# Patient Record
Sex: Female | Born: 1960 | Race: White | Hispanic: No | Smoking: Former smoker
Health system: Southern US, Community
[De-identification: ages and names within clinical notes are randomized; demographics above are authoritative.]

## PROBLEM LIST (undated history)

## (undated) DIAGNOSIS — J189 Pneumonia, unspecified organism: Secondary | ICD-10-CM

## (undated) DIAGNOSIS — M546 Pain in thoracic spine: Secondary | ICD-10-CM

## (undated) DIAGNOSIS — M545 Low back pain, unspecified: Secondary | ICD-10-CM

## (undated) DIAGNOSIS — K219 Gastro-esophageal reflux disease without esophagitis: Secondary | ICD-10-CM

## (undated) DIAGNOSIS — G8929 Other chronic pain: Secondary | ICD-10-CM

## (undated) DIAGNOSIS — R519 Headache, unspecified: Secondary | ICD-10-CM

## (undated) DIAGNOSIS — M47816 Spondylosis without myelopathy or radiculopathy, lumbar region: Secondary | ICD-10-CM

## (undated) DIAGNOSIS — G894 Chronic pain syndrome: Secondary | ICD-10-CM

## (undated) DIAGNOSIS — Z79890 Hormone replacement therapy (postmenopausal): Secondary | ICD-10-CM

## (undated) DIAGNOSIS — R300 Dysuria: Secondary | ICD-10-CM

## (undated) DIAGNOSIS — R51 Headache: Secondary | ICD-10-CM

## (undated) DIAGNOSIS — R200 Anesthesia of skin: Secondary | ICD-10-CM

## (undated) DIAGNOSIS — M5126 Other intervertebral disc displacement, lumbar region: Secondary | ICD-10-CM

## (undated) DIAGNOSIS — C441291 Squamous cell carcinoma of skin of left upper eyelid, including canthus: Secondary | ICD-10-CM

## (undated) DIAGNOSIS — C569 Malignant neoplasm of unspecified ovary: Secondary | ICD-10-CM

## (undated) DIAGNOSIS — C73 Malignant neoplasm of thyroid gland: Secondary | ICD-10-CM

## (undated) DIAGNOSIS — I208 Other forms of angina pectoris: Secondary | ICD-10-CM

## (undated) DIAGNOSIS — K589 Irritable bowel syndrome without diarrhea: Secondary | ICD-10-CM

## (undated) DIAGNOSIS — I2089 Other forms of angina pectoris: Secondary | ICD-10-CM

## (undated) DIAGNOSIS — M810 Age-related osteoporosis without current pathological fracture: Secondary | ICD-10-CM

## (undated) DIAGNOSIS — R202 Paresthesia of skin: Secondary | ICD-10-CM

## (undated) DIAGNOSIS — M542 Cervicalgia: Secondary | ICD-10-CM

## (undated) DIAGNOSIS — M5416 Radiculopathy, lumbar region: Secondary | ICD-10-CM

## (undated) DIAGNOSIS — J449 Chronic obstructive pulmonary disease, unspecified: Secondary | ICD-10-CM

## (undated) DIAGNOSIS — I200 Unstable angina: Secondary | ICD-10-CM

## (undated) DIAGNOSIS — F419 Anxiety disorder, unspecified: Secondary | ICD-10-CM

## (undated) DIAGNOSIS — N959 Unspecified menopausal and perimenopausal disorder: Secondary | ICD-10-CM

## (undated) DIAGNOSIS — W1789XA Other fall from one level to another, initial encounter: Secondary | ICD-10-CM

## (undated) DIAGNOSIS — J42 Unspecified chronic bronchitis: Secondary | ICD-10-CM

## (undated) DIAGNOSIS — Z87898 Personal history of other specified conditions: Secondary | ICD-10-CM

## (undated) DIAGNOSIS — G43009 Migraine without aura, not intractable, without status migrainosus: Secondary | ICD-10-CM

## (undated) HISTORY — DX: Other forms of angina pectoris: I20.89

## (undated) HISTORY — PX: SKIN CANCER EXCISION: SHX779

## (undated) HISTORY — DX: Spondylosis without myelopathy or radiculopathy, lumbar region: M47.816

## (undated) HISTORY — PX: CARDIAC CATHETERIZATION: SHX172

## (undated) HISTORY — DX: Chronic pain syndrome: G89.4

## (undated) HISTORY — DX: Chronic obstructive pulmonary disease, unspecified: J44.9

## (undated) HISTORY — DX: Pain in thoracic spine: M54.6

## (undated) HISTORY — DX: Dysuria: R30.0

## (undated) HISTORY — DX: Anxiety disorder, unspecified: F41.9

## (undated) HISTORY — DX: Migraine without aura, not intractable, without status migrainosus: G43.009

## (undated) HISTORY — PX: TUBAL LIGATION: SHX77

## (undated) HISTORY — DX: Irritable bowel syndrome, unspecified: K58.9

## (undated) HISTORY — PX: THYROIDECTOMY: SHX17

## (undated) HISTORY — DX: Paresthesia of skin: R20.2

## (undated) HISTORY — DX: Personal history of other specified conditions: Z87.898

## (undated) HISTORY — PX: CHOLECYSTECTOMY OPEN: SUR202

## (undated) HISTORY — PX: DILATION AND CURETTAGE OF UTERUS: SHX78

## (undated) HISTORY — DX: Unstable angina: I20.0

## (undated) HISTORY — PX: VAGINAL HYSTERECTOMY: SUR661

## (undated) HISTORY — DX: Other intervertebral disc displacement, lumbar region: M51.26

## (undated) HISTORY — DX: Anesthesia of skin: R20.0

## (undated) HISTORY — DX: Age-related osteoporosis without current pathological fracture: M81.0

## (undated) HISTORY — DX: Malignant neoplasm of unspecified ovary: C56.9

## (undated) HISTORY — DX: Radiculopathy, lumbar region: M54.16

## (undated) HISTORY — DX: Gastro-esophageal reflux disease without esophagitis: K21.9

## (undated) HISTORY — DX: Hormone replacement therapy: Z79.890

## (undated) HISTORY — DX: Squamous cell carcinoma of skin of left upper eyelid, including canthus: C44.1291

## (undated) HISTORY — DX: Cervicalgia: M54.2

## (undated) HISTORY — DX: Other forms of angina pectoris: I20.8

## (undated) HISTORY — DX: Unspecified menopausal and perimenopausal disorder: N95.9

## (undated) HISTORY — PX: BACK SURGERY: SHX140

## (undated) HISTORY — DX: Malignant neoplasm of thyroid gland: C73

---

## 1988-09-11 DIAGNOSIS — C569 Malignant neoplasm of unspecified ovary: Secondary | ICD-10-CM

## 1988-09-11 HISTORY — DX: Malignant neoplasm of unspecified ovary: C56.9

## 1998-09-11 HISTORY — PX: POSTERIOR LUMBAR FUSION: SHX6036

## 2001-11-18 ENCOUNTER — Other Ambulatory Visit: Admission: RE | Admit: 2001-11-18 | Discharge: 2001-11-18 | Payer: Self-pay | Admitting: Family Medicine

## 2003-10-08 ENCOUNTER — Other Ambulatory Visit: Admission: RE | Admit: 2003-10-08 | Discharge: 2003-10-08 | Payer: Self-pay | Admitting: Family Medicine

## 2004-08-08 ENCOUNTER — Ambulatory Visit: Payer: Self-pay | Admitting: Family Medicine

## 2005-04-04 ENCOUNTER — Ambulatory Visit: Payer: Self-pay | Admitting: Family Medicine

## 2005-04-04 ENCOUNTER — Other Ambulatory Visit: Admission: RE | Admit: 2005-04-04 | Discharge: 2005-04-04 | Payer: Self-pay | Admitting: Family Medicine

## 2005-05-18 ENCOUNTER — Ambulatory Visit: Payer: Self-pay | Admitting: Family Medicine

## 2005-10-05 ENCOUNTER — Ambulatory Visit: Payer: Self-pay | Admitting: Family Medicine

## 2009-08-24 ENCOUNTER — Encounter: Admission: RE | Admit: 2009-08-24 | Discharge: 2009-08-24 | Payer: Self-pay | Admitting: Cardiovascular Disease

## 2009-08-25 ENCOUNTER — Ambulatory Visit (HOSPITAL_COMMUNITY): Admission: RE | Admit: 2009-08-25 | Discharge: 2009-08-25 | Payer: Self-pay | Admitting: Cardiovascular Disease

## 2011-01-19 ENCOUNTER — Ambulatory Visit
Admission: RE | Admit: 2011-01-19 | Discharge: 2011-01-19 | Disposition: A | Discharge status: Home / Self Care | Payer: Medicare Other | Source: Ambulatory Visit | Attending: Cardiovascular Disease | Admitting: Cardiovascular Disease

## 2011-01-19 ENCOUNTER — Other Ambulatory Visit: Payer: Self-pay | Admitting: Cardiovascular Disease

## 2011-01-30 ENCOUNTER — Ambulatory Visit (HOSPITAL_COMMUNITY): Admission: RE | Admit: 2011-01-30 | Payer: Medicare Other | Source: Ambulatory Visit | Admitting: Cardiovascular Disease

## 2015-11-02 DIAGNOSIS — M546 Pain in thoracic spine: Secondary | ICD-10-CM

## 2015-11-02 DIAGNOSIS — M5416 Radiculopathy, lumbar region: Secondary | ICD-10-CM

## 2015-11-02 DIAGNOSIS — M542 Cervicalgia: Secondary | ICD-10-CM

## 2015-11-02 DIAGNOSIS — G43009 Migraine without aura, not intractable, without status migrainosus: Secondary | ICD-10-CM | POA: Insufficient documentation

## 2015-11-02 DIAGNOSIS — G894 Chronic pain syndrome: Secondary | ICD-10-CM

## 2015-11-02 HISTORY — DX: Pain in thoracic spine: M54.6

## 2015-11-02 HISTORY — DX: Migraine without aura, not intractable, without status migrainosus: G43.009

## 2015-11-02 HISTORY — DX: Radiculopathy, lumbar region: M54.16

## 2015-11-02 HISTORY — DX: Cervicalgia: M54.2

## 2015-11-02 HISTORY — DX: Chronic pain syndrome: G89.4

## 2016-01-05 DIAGNOSIS — N959 Unspecified menopausal and perimenopausal disorder: Secondary | ICD-10-CM

## 2016-01-05 HISTORY — DX: Unspecified menopausal and perimenopausal disorder: N95.9

## 2016-02-10 DIAGNOSIS — R200 Anesthesia of skin: Secondary | ICD-10-CM

## 2016-02-10 DIAGNOSIS — R202 Paresthesia of skin: Secondary | ICD-10-CM

## 2016-02-10 HISTORY — DX: Anesthesia of skin: R20.0

## 2016-03-28 DIAGNOSIS — M5136 Other intervertebral disc degeneration, lumbar region: Secondary | ICD-10-CM

## 2016-03-28 DIAGNOSIS — M51369 Other intervertebral disc degeneration, lumbar region without mention of lumbar back pain or lower extremity pain: Secondary | ICD-10-CM | POA: Insufficient documentation

## 2016-03-28 DIAGNOSIS — M5126 Other intervertebral disc displacement, lumbar region: Secondary | ICD-10-CM

## 2016-03-28 HISTORY — DX: Other intervertebral disc degeneration, lumbar region: M51.36

## 2016-03-28 HISTORY — DX: Other intervertebral disc degeneration, lumbar region without mention of lumbar back pain or lower extremity pain: M51.369

## 2016-03-28 HISTORY — DX: Other intervertebral disc displacement, lumbar region: M51.26

## 2016-04-19 DIAGNOSIS — M47816 Spondylosis without myelopathy or radiculopathy, lumbar region: Secondary | ICD-10-CM

## 2016-04-19 HISTORY — DX: Spondylosis without myelopathy or radiculopathy, lumbar region: M47.816

## 2016-12-20 DIAGNOSIS — Z87898 Personal history of other specified conditions: Secondary | ICD-10-CM

## 2016-12-20 HISTORY — DX: Personal history of other specified conditions: Z87.898

## 2017-01-31 DIAGNOSIS — Z79890 Hormone replacement therapy (postmenopausal): Secondary | ICD-10-CM

## 2017-01-31 DIAGNOSIS — R300 Dysuria: Secondary | ICD-10-CM | POA: Insufficient documentation

## 2017-01-31 HISTORY — DX: Hormone replacement therapy: Z79.890

## 2017-01-31 HISTORY — DX: Dysuria: R30.0

## 2017-02-07 DIAGNOSIS — Z90710 Acquired absence of both cervix and uterus: Secondary | ICD-10-CM | POA: Insufficient documentation

## 2017-06-19 DIAGNOSIS — R002 Palpitations: Secondary | ICD-10-CM | POA: Diagnosis not present

## 2017-06-29 ENCOUNTER — Telehealth: Payer: Self-pay | Admitting: Cardiovascular Disease

## 2017-06-29 NOTE — Telephone Encounter (Signed)
Received records from [facility] for appointment on 07/20/17 with Dr Gwenlyn Found.  Records put with Dr Kennon Holter schedule for 11/9/

## 2017-07-20 ENCOUNTER — Ambulatory Visit: Payer: Medicare Other | Admitting: Cardiovascular Disease

## 2017-07-31 ENCOUNTER — Ambulatory Visit: Payer: Medicare Other | Admitting: Cardiovascular Disease

## 2017-08-15 ENCOUNTER — Ambulatory Visit: Payer: Medicare Other | Admitting: Cardiovascular Disease

## 2017-08-21 ENCOUNTER — Ambulatory Visit: Payer: Medicare Other | Admitting: Cardiovascular Disease

## 2017-08-24 ENCOUNTER — Ambulatory Visit: Payer: Medicare Other | Admitting: Cardiovascular Disease

## 2017-09-14 ENCOUNTER — Ambulatory Visit: Payer: Medicare Other | Admitting: Cardiovascular Disease

## 2017-09-25 ENCOUNTER — Ambulatory Visit: Payer: Medicare Other | Admitting: Cardiovascular Disease

## 2017-09-26 ENCOUNTER — Encounter: Payer: Self-pay | Admitting: *Deleted

## 2018-01-17 DIAGNOSIS — R002 Palpitations: Secondary | ICD-10-CM | POA: Diagnosis not present

## 2018-01-28 DIAGNOSIS — I209 Angina pectoris, unspecified: Secondary | ICD-10-CM | POA: Insufficient documentation

## 2018-01-28 DIAGNOSIS — I200 Unstable angina: Secondary | ICD-10-CM

## 2018-01-28 DIAGNOSIS — J449 Chronic obstructive pulmonary disease, unspecified: Secondary | ICD-10-CM

## 2018-01-28 DIAGNOSIS — K589 Irritable bowel syndrome without diarrhea: Secondary | ICD-10-CM | POA: Insufficient documentation

## 2018-01-28 DIAGNOSIS — K219 Gastro-esophageal reflux disease without esophagitis: Secondary | ICD-10-CM

## 2018-01-28 DIAGNOSIS — F411 Generalized anxiety disorder: Secondary | ICD-10-CM | POA: Insufficient documentation

## 2018-01-28 HISTORY — DX: Gastro-esophageal reflux disease without esophagitis: K21.9

## 2018-01-28 HISTORY — DX: Unstable angina: I20.0

## 2018-01-28 HISTORY — DX: Chronic obstructive pulmonary disease, unspecified: J44.9

## 2018-01-29 DIAGNOSIS — C569 Malignant neoplasm of unspecified ovary: Secondary | ICD-10-CM | POA: Insufficient documentation

## 2018-02-05 DIAGNOSIS — R079 Chest pain, unspecified: Secondary | ICD-10-CM | POA: Insufficient documentation

## 2018-02-05 NOTE — Progress Notes (Signed)
Cardiology Office Note:    Date:  02/06/2018   ID:  Melissa Conley, DOB [birth year], MRN [id number]  PCP:  Melissa Roche, NP  Cardiologist:  Melissa More, MD   Referring MD: Melissa Roche, NP  ASSESSMENT:    1. Chest pain in adult   2. Chronic obstructive pulmonary disease, unspecified COPD type ([place])   3. Chronic pain syndrome   4. SOB (shortness of breath) on exertion    PLAN:    In order of problems listed above:  1. Her symptoms are atypical but occurring Conley frequently and at times are anginal in nature.  Is been close to 10 years since she had normal coronary arteriography I think she would benefit for further evaluation and after discussion of noninvasive techniques will undergo cardiac CTA.  She has no dye allergy she will continue low-dose aspirin and I will reassess back in the office in 6 weeks 2. Likely because of her shortness of breath however she is at risk for underlying heart disease heart failure with edema have asked her to schedule echocardiogram and with labs today we will check a BNP 3. Stable managed by her PCP 4. Differential diagnosis is that of COPD versus cardiac etiology especially heart failure.  BNP level ordered echocardiogram scheduled  Next appointment 6 weeks   Medication Adjustments/Labs and Tests Ordered: Current medicines are reviewed at length with the patient today.  Concerns regarding medicines are outlined above.  Orders Placed This Encounter  Procedures  . CT CORONARY MORPH W/CTA COR W/SCORE W/CA W/CM &/OR WO/CM  . CT CORONARY FRACTIONAL FLOW RESERVE DATA PREP  . CT CORONARY FRACTIONAL FLOW RESERVE FLUID ANALYSIS  . Troponin I  . CBC  . Comprehensive Metabolic Panel (CMET)  . Lipid Profile  . EKG 12-Lead  . ECHOCARDIOGRAM COMPLETE   Meds ordered this encounter  Medications  . metoprolol tartrate (LOPRESSOR) 50 MG tablet    Sig: Take 1 tablet (50 mg total) by mouth once for 1 dose. Take 1 tablet 1 hour prior to cardiac CTA.   Dispense:  1 tablet    Refill:  0     Chief Complaint  Patient presents with  . New Patient (Initial Visit)  . Chest Pain    History of Present Illness:    Melissa Conley is a 57 y.o. female with COPD, chronic pain and narcotic therapy and  normal coronary arteriography 08/25/09 who is being seen today for the evaluation of chest pain at the request of Melissa Roche, NP.  Recently she has had increased chest pain.  She was last seen by 1 of my former partners less than 2 years ago had a myocardial perfusion study that apparently was normal and she had no further follow-up in the office.  She has symptoms that sound clearly musculoskeletal with pain in the left shoulder down the hand with numbness and paresthesia.  She is also having episodes that tend to wake her from her sleep happens with activities such as bathing and walking the dog where she gets pressure substernally with severe shortness of breath and forces her to stop and rest.  The episodes also occur without physical effort.  She underwent coronary arteriography was normal 10 years ago.  The symptoms have worsened in the nocturnal and at exertional symptoms are new.  It is not pleuritic in nature but is associated with marked shortness of breath and she finds herself increasingly short of breath even with activities like ADLs.  She says she  gets intermittent edema on her ankles and because of chronic back pain sleeps with marked elevation of the head of the bed.  She has had palpitation not severe sustained has had dizziness but no syncope.  She has no background history of congenital or rheumatic heart disease  She was seen by Melissa Conley 06/07/16: 1. Atypical chest pain is probably chest wall, doubt angina 2. DOE attributable to asthma, could possibly represent anginal equivalent 3. Mild mitral regurg on echo can be a normal finding, especially if as per report the valve is structurally normal, and there is no mitral regurg murmur  on Px exam PLAN: Lexiscan perfusion stress test RTC Cardiology: PRN abnl test results HPI: 57 yo WF followed by Melissa Conley group for asthma on inhalers, in whom complaints of several months of Conley prominent exertional dyspnea and episodid well-localized non-exertional upper chest discomfort prompted an echocardiographic exam; Pt is referred by Melissa Roche NP for reported mild mitral regurg on the echo. As noted, pt describes worsening exertional dyspnea in recent months, no orthopnea or PND. Some occasional mild bipedal edema. Chest pains are both right and left upper chest, no clear precipitating or exacerbating factors. No assoc nausea. Occas transient palpitations, no sustained rapid heart action syncope or near-syncope.    Past Medical History:  Diagnosis Date  . Acid reflux   . Angina at rest [facility])   . Anxiety   . Bulging lumbar disc 03/28/2016   Overview:  L4-5, left  . Chronic back pain   . Chronic pain syndrome 11/02/2015  . Common migraine 11/02/2015  . COPD (chronic obstructive pulmonary disease) ([place]) 01/28/2018  . Dysuria 01/31/2017  . Generalized anxiety disorder 01/28/2018  . GERD without esophagitis 01/28/2018  . H/O: hysterectomy 02/07/2017  . History of abnormal mammogram 12/20/2016  . IBS (irritable bowel syndrome)   . Lumbar radiculopathy 11/02/2015  . Menopausal disorder 01/05/2016  . Neck pain 11/02/2015  . Numbness and tingling of both legs below knees 02/10/2016  . Ovarian cancer ([place])   . Postmenopausal HRT (hormone replacement therapy) 01/31/2017  . 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 Thoracic back pain 11/02/2015  . Unstable angina ([place]) 01/28/2018    Past Surgical History:  Procedure Laterality Date  . ABDOMINAL HYSTERECTOMY    . CARDIAC CATHETERIZATION    . CHOLECYSTECTOMY    . LUMBAR FUSION  2000    Current Medications: Current Meds  Medication Sig  . albuterol (PROAIR HFA) 108 (90 Base) MCG/ACT inhaler Inhale 1  puff into the lungs 4 (four) times daily as needed for shortness of breath.   Marland Kitchen aspirin EC 81 MG tablet Take 81 mg by mouth daily.  . cyanocobalamin (,VITAMIN B-12,) 1000 MCG/ML injection Inject 1 mL into the muscle every 30 (thirty) days.  Marland Kitchen EPINEPHrine 0.3 mg/0.3 mL IJ SOAJ injection Inject 0.3 mg into the muscle as directed.  . estrogens, conjugated, (PREMARIN) 0.625 MG tablet Take 0.625 mg by mouth daily.  . fluticasone (FLONASE) 50 MCG/ACT nasal spray Place 1 spray into both nostrils daily as needed for allergies or rhinitis.   Marland Kitchen loratadine (CLARITIN) 10 MG tablet Take 10 mg by mouth daily as needed for allergies.   . Multiple Vitamin (MULTIVITAMIN) tablet Take 1 tablet by mouth daily.  . nitroGLYCERIN (NITROSTAT) 0.4 MG SL tablet Place 0.4 mg every 5 (five) minutes as needed under the tongue for chest pain.  Marland Kitchen oxyCODONE (ROXICODONE) 15 MG immediate release tablet Take 15 mg every  4 (four) hours as needed by mouth for pain.  . Polyethylene Glycol 3350 (PEG 3350) POWD Take by mouth. TAKE AS DIRECTED     Allergies:   Other; Penicillins; Gabapentin; Tapentadol; Methocarbamol; Morphine; and Tizanidine   Social History   Socioeconomic History  . Marital status: Single    Spouse name: Not on file  . Number of children: Not on file  . Years of education: Not on file  . Highest education level: Not on file  Occupational History  . Not on file  Social Needs  . Financial resource strain: Not on file  . Food insecurity:    Worry: Not on file    Inability: Not on file  . Transportation needs:    Medical: Not on file    Non-medical: Not on file  Tobacco Use  . Smoking status: Former Smoker    Packs/day: 0.50    Years: 10.00    Pack years: 5.00  . Smokeless tobacco: Never Used  . Tobacco comment: quit 14 years ago   Substance and Sexual Activity  . Alcohol use: Not Currently  . Drug use: Not Currently  . Sexual activity: Not on file  Lifestyle  . Physical activity:    Days per  week: Not on file    Minutes per session: Not on file  . Stress: Not on file  Relationships  . Social connections:    Talks on phone: Not on file    Gets together: Not on file    Attends religious service: Not on file    Active member of club or organization: Not on file    Attends meetings of clubs or organizations: Not on file    Relationship status: Not on file  Other Topics Concern  . Not on file  Social History Narrative  . Not on file     Family History: The patient's family history includes Bone cancer in her brother; Heart attack in her mother; Lung cancer in her brother and mother.  ROS:   Review of Systems  Constitution: Positive for malaise/fatigue.  HENT: Negative.   Cardiovascular: Positive for chest pain, dyspnea on exertion and palpitations.  Respiratory: Positive for cough and shortness of breath. Negative for sputum production and wheezing.   Endocrine: Negative.   Hematologic/Lymphatic: Negative.   Skin: Negative.   Musculoskeletal: Positive for back pain.  Gastrointestinal: Negative.   Genitourinary: Negative.   Neurological: Positive for dizziness.  Psychiatric/Behavioral: Positive for memory loss.  Allergic/Immunologic: Negative.    Please see the history of present illness.     All other systems reviewed and are negative.  EKGs/Labs/Other Studies Reviewed:    The following studies were reviewed today:   EKG:  EKG is  ordered today.  The ekg ordered today demonstrates sinus rhythm normal  Recent Labs: Office labs from December reviewed No results found for requested labs within last 8760 hours.  Recent Lipid Panel No results found for: CHOL, TRIG, HDL, CHOLHDL, VLDL, LDLCALC, LDLDIRECT  Physical Exam:    VS:  BP (!) 88/68 (BP Location: Left Arm, Patient Position: Sitting, Cuff Size: Normal)   Pulse 60   Ht 5\' 5"  (1.651 m)   Wt 170 lb (77.1 kg)   SpO2 98%   BMI 28.29 kg/m     Wt Readings from Last 3 Encounters:  02/06/18 170 lb (77.1  kg)     GEN:  Well nourished, well developed in no acute distress HEENT: Normal NECK: No JVD; No carotid bruits LYMPHATICS: No  lymphadenopathy CARDIAC: RRR, no murmurs, rubs, gallops RESPIRATORY:  Clear to auscultation without rales, wheezing or rhonchi  ABDOMEN: Soft, non-tender, non-distended MUSCULOSKELETAL:  No edema; No deformity  SKIN: Warm and dry NEUROLOGIC:  Alert and oriented x 3 PSYCHIATRIC:  Normal affect     Signed, Melissa More, MD  02/06/2018 11:31 AM    [place]

## 2018-02-06 ENCOUNTER — Encounter: Payer: Self-pay | Admitting: Cardiology

## 2018-02-06 ENCOUNTER — Ambulatory Visit (INDEPENDENT_AMBULATORY_CARE_PROVIDER_SITE_OTHER): Payer: Medicare Other | Admitting: Cardiology

## 2018-02-06 VITALS — BP 88/68 | HR 60 | Ht 65.0 in | Wt 170.0 lb

## 2018-02-06 DIAGNOSIS — I209 Angina pectoris, unspecified: Secondary | ICD-10-CM

## 2018-02-06 DIAGNOSIS — R0602 Shortness of breath: Secondary | ICD-10-CM | POA: Diagnosis not present

## 2018-02-06 DIAGNOSIS — R079 Chest pain, unspecified: Secondary | ICD-10-CM | POA: Diagnosis not present

## 2018-02-06 DIAGNOSIS — G894 Chronic pain syndrome: Secondary | ICD-10-CM

## 2018-02-06 DIAGNOSIS — J449 Chronic obstructive pulmonary disease, unspecified: Secondary | ICD-10-CM

## 2018-02-06 MED ORDER — METOPROLOL TARTRATE 50 MG PO TABS: 50.0000 mg | ORAL_TABLET | Freq: Once | ORAL | 0 refills | Status: DC

## 2018-02-06 NOTE — Patient Instructions (Addendum)
Medication Instructions:  Your physician recommends that you continue on your current medications as directed. Please refer to the Current Medication list given to you today.  Labwork: Your physician recommends that you have the following labs drawn: CBC, CMP, lipid, troponin  Testing/Procedures: Your physician has requested that you have an echocardiogram. Echocardiography is a painless test that uses sound waves to create images of your heart. It provides your doctor with information about the size and shape of your heart and how well your heart's chambers and valves are working. This procedure takes approximately one hour. There are no restrictions for this procedure.  Your physician has requested that you have cardiac CT. Cardiac computed tomography (CT) is a painless test that uses an x-ray machine to take clear, detailed pictures of your heart. For further information please visit [email or web address]tn. Please follow instruction sheet as given.  Please arrive at the [facility] main entrance of [facility] at xx:xx AM (30-45 minutes prior to test start time)  [facility], [place] [address] [phone]  Proceed to the [facility] Radiology Department (First Floor).  Please follow these instructions carefully (unless otherwise directed):  Hold all erectile dysfunction medications at least 48 hours prior to test.  On the Night Before the Test: . Drink plenty of water. . Do not consume any caffeinated/decaffeinated beverages or chocolate 12 hours prior to your test. . Do not take any antihistamines 12 hours prior to your test.  On the Day of the Test: . Drink plenty of water. Do not drink any water within one hour of the test. . Do not eat any food 4 hours prior to the test. . You may take your regular medications prior to the test. . IF NOT ON A BETA BLOCKER - Take 50 mg of lopressor (metoprolol) one hour before the test.  After the  Test: . Drink plenty of water. . After receiving IV contrast, you may experience a mild flushed feeling. This is normal. . On occasion, you may experience a mild rash up to 24 hours after the test. This is not dangerous. If this occurs, you can take Benadryl 25 mg and increase your fluid intake. . If you experience trouble breathing, this can be serious. If it is severe call 911 IMMEDIATELY. If it is mild, please call our office.  Follow-Up: Your physician recommends that you schedule a follow-up appointment in: 6 weeks.  Any Other Special Instructions Will Be Listed Below (If Applicable).     If you need a refill on your cardiac medications before your next appointment, please call your pharmacy.

## 2018-02-07 LAB — CBC
HEMATOCRIT: 39.8 % (ref 34.0–46.6)
HEMOGLOBIN: 12.9 g/dL (ref 11.1–15.9)
MCH: 28.9 pg (ref 26.6–33.0)
MCHC: 32.4 g/dL (ref 31.5–35.7)
MCV: 89 fL (ref 79–97)
Platelets: 359 10*3/uL (ref 150–450)
RBC: 4.47 x10E6/uL (ref 3.77–5.28)
RDW: 13.3 % (ref 12.3–15.4)
WBC: 5.9 10*3/uL (ref 3.4–10.8)

## 2018-02-07 LAB — LIPID PANEL
CHOL/HDL RATIO: 3.2 ratio (ref 0.0–4.4)
Cholesterol, Total: 198 mg/dL (ref 100–199)
HDL: 62 mg/dL (ref >39)
LDL Calculated: 103 mg/dL — ABNORMAL HIGH (ref 0–99)
TRIGLYCERIDES: 167 mg/dL — AB (ref 0–149)
VLDL Cholesterol Cal: 33 mg/dL (ref 5–40)

## 2018-02-07 LAB — COMPREHENSIVE METABOLIC PANEL
A/G RATIO: 1.6 (ref 1.2–2.2)
ALBUMIN: 4.5 g/dL (ref 3.5–5.5)
ALT: 18 IU/L (ref 0–32)
AST: 19 IU/L (ref 0–40)
Alkaline Phosphatase: 99 IU/L (ref 39–117)
BUN / CREAT RATIO: 13 (ref 9–23)
BUN: 10 mg/dL (ref 6–24)
Bilirubin Total: 0.3 mg/dL (ref 0.0–1.2)
CO2: 24 mmol/L (ref 20–29)
Calcium: 9.7 mg/dL (ref 8.7–10.2)
Chloride: 102 mmol/L (ref 96–106)
Creatinine, Ser: 0.76 mg/dL (ref 0.57–1.00)
GFR calc Af Amer: 103 mL/min/{1.73_m2} (ref >59)
GFR, EST NON AFRICAN AMERICAN: 89 mL/min/{1.73_m2} (ref >59)
GLOBULIN, TOTAL: 2.8 g/dL (ref 1.5–4.5)
Glucose: 85 mg/dL (ref 65–99)
POTASSIUM: 4.7 mmol/L (ref 3.5–5.2)
SODIUM: 143 mmol/L (ref 134–144)
Total Protein: 7.3 g/dL (ref 6.0–8.5)

## 2018-02-07 LAB — TROPONIN I: Troponin I: <0.01 ng/mL (ref 0.00–0.04)

## 2018-02-21 ENCOUNTER — Ambulatory Visit (HOSPITAL_BASED_OUTPATIENT_CLINIC_OR_DEPARTMENT_OTHER)
Admission: RE | Admit: 2018-02-21 | Discharge: 2018-02-21 | Disposition: A | Discharge status: Home / Self Care | Payer: Medicare Other | Source: Ambulatory Visit | Attending: Cardiology | Admitting: Cardiology

## 2018-02-21 DIAGNOSIS — R0600 Dyspnea, unspecified: Secondary | ICD-10-CM | POA: Diagnosis present

## 2018-02-21 DIAGNOSIS — J449 Chronic obstructive pulmonary disease, unspecified: Secondary | ICD-10-CM

## 2018-02-21 DIAGNOSIS — I517 Cardiomegaly: Secondary | ICD-10-CM | POA: Insufficient documentation

## 2018-02-21 DIAGNOSIS — R079 Chest pain, unspecified: Secondary | ICD-10-CM | POA: Insufficient documentation

## 2018-02-21 DIAGNOSIS — R0602 Shortness of breath: Secondary | ICD-10-CM | POA: Diagnosis not present

## 2018-02-21 DIAGNOSIS — I340 Nonrheumatic mitral (valve) insufficiency: Secondary | ICD-10-CM | POA: Diagnosis not present

## 2018-02-21 NOTE — Progress Notes (Signed)
Echocardiogram 2D Echocardiogram has been performed.  Melissa Conley 02/21/2018, 2:26 PM

## 2018-03-22 NOTE — Addendum Note (Signed)
Addended by: Mattie Marlin on: 03/22/2018 03:04 PM   Modules accepted: Orders

## 2018-03-25 DIAGNOSIS — W1789XA Other fall from one level to another, initial encounter: Secondary | ICD-10-CM | POA: Insufficient documentation

## 2018-03-25 HISTORY — DX: Other fall from one level to another, initial encounter: W17.89XA

## 2018-03-26 ENCOUNTER — Inpatient Hospital Stay (HOSPITAL_COMMUNITY): Payer: Medicare Other

## 2018-03-26 ENCOUNTER — Telehealth: Payer: Self-pay | Admitting: Cardiology

## 2018-03-26 ENCOUNTER — Inpatient Hospital Stay (HOSPITAL_COMMUNITY)
Admission: AD | Admit: 2018-03-26 | Discharge: 2018-03-30 | DRG: 494 | Disposition: A | Discharge status: Home Health | Payer: Medicare Other | Source: Ambulatory Visit | Attending: Orthopedic Surgery | Admitting: Orthopedic Surgery

## 2018-03-26 ENCOUNTER — Other Ambulatory Visit: Payer: Self-pay | Admitting: Orthopedic Surgery

## 2018-03-26 ENCOUNTER — Encounter (HOSPITAL_COMMUNITY): Payer: Self-pay | Admitting: General Practice

## 2018-03-26 ENCOUNTER — Other Ambulatory Visit: Payer: Self-pay

## 2018-03-26 DIAGNOSIS — Z7982 Long term (current) use of aspirin: Secondary | ICD-10-CM

## 2018-03-26 DIAGNOSIS — S92062A Displaced intraarticular fracture of left calcaneus, initial encounter for closed fracture: Secondary | ICD-10-CM | POA: Diagnosis present

## 2018-03-26 DIAGNOSIS — G894 Chronic pain syndrome: Secondary | ICD-10-CM | POA: Diagnosis present

## 2018-03-26 DIAGNOSIS — J449 Chronic obstructive pulmonary disease, unspecified: Secondary | ICD-10-CM | POA: Diagnosis present

## 2018-03-26 DIAGNOSIS — Z888 Allergy status to other drugs, medicaments and biological substances status: Secondary | ICD-10-CM

## 2018-03-26 DIAGNOSIS — Z87891 Personal history of nicotine dependence: Secondary | ICD-10-CM

## 2018-03-26 DIAGNOSIS — S82851A Displaced trimalleolar fracture of right lower leg, initial encounter for closed fracture: Secondary | ICD-10-CM | POA: Diagnosis present

## 2018-03-26 DIAGNOSIS — S92002A Unspecified fracture of left calcaneus, initial encounter for closed fracture: Secondary | ICD-10-CM | POA: Diagnosis not present

## 2018-03-26 DIAGNOSIS — Z90710 Acquired absence of both cervix and uterus: Secondary | ICD-10-CM | POA: Diagnosis not present

## 2018-03-26 DIAGNOSIS — Z8543 Personal history of malignant neoplasm of ovary: Secondary | ICD-10-CM | POA: Diagnosis not present

## 2018-03-26 DIAGNOSIS — Z01818 Encounter for other preprocedural examination: Secondary | ICD-10-CM

## 2018-03-26 DIAGNOSIS — Z981 Arthrodesis status: Secondary | ICD-10-CM

## 2018-03-26 DIAGNOSIS — Z79890 Hormone replacement therapy (postmenopausal): Secondary | ICD-10-CM | POA: Diagnosis not present

## 2018-03-26 DIAGNOSIS — Z801 Family history of malignant neoplasm of trachea, bronchus and lung: Secondary | ICD-10-CM

## 2018-03-26 DIAGNOSIS — Z419 Encounter for procedure for purposes other than remedying health state, unspecified: Secondary | ICD-10-CM

## 2018-03-26 DIAGNOSIS — Z780 Asymptomatic menopausal state: Secondary | ICD-10-CM | POA: Diagnosis not present

## 2018-03-26 DIAGNOSIS — Z885 Allergy status to narcotic agent status: Secondary | ICD-10-CM

## 2018-03-26 DIAGNOSIS — Z87892 Personal history of anaphylaxis: Secondary | ICD-10-CM | POA: Diagnosis not present

## 2018-03-26 DIAGNOSIS — Z880 Allergy status to penicillin: Secondary | ICD-10-CM | POA: Diagnosis not present

## 2018-03-26 DIAGNOSIS — F411 Generalized anxiety disorder: Secondary | ICD-10-CM | POA: Diagnosis present

## 2018-03-26 DIAGNOSIS — M961 Postlaminectomy syndrome, not elsewhere classified: Secondary | ICD-10-CM | POA: Diagnosis not present

## 2018-03-26 DIAGNOSIS — Z887 Allergy status to serum and vaccine status: Secondary | ICD-10-CM | POA: Diagnosis not present

## 2018-03-26 DIAGNOSIS — W1789XA Other fall from one level to another, initial encounter: Secondary | ICD-10-CM | POA: Diagnosis present

## 2018-03-26 HISTORY — DX: Low back pain: M54.5

## 2018-03-26 HISTORY — DX: Low back pain, unspecified: M54.50

## 2018-03-26 HISTORY — DX: Other chronic pain: G89.29

## 2018-03-26 HISTORY — DX: Pneumonia, unspecified organism: J18.9

## 2018-03-26 HISTORY — DX: Unspecified chronic bronchitis: J42

## 2018-03-26 HISTORY — DX: Headache, unspecified: R51.9

## 2018-03-26 HISTORY — DX: Other fall from one level to another, initial encounter: W17.89XA

## 2018-03-26 HISTORY — DX: Headache: R51

## 2018-03-26 LAB — COMPREHENSIVE METABOLIC PANEL
ALBUMIN: 3.4 g/dL — AB (ref 3.5–5.0)
ALT: 75 U/L — ABNORMAL HIGH (ref 0–44)
AST: 63 U/L — AB (ref 15–41)
Alkaline Phosphatase: 139 U/L — ABNORMAL HIGH (ref 38–126)
Anion gap: 9 (ref 5–15)
BILIRUBIN TOTAL: 0.7 mg/dL (ref 0.3–1.2)
BUN: 7 mg/dL (ref 6–20)
CO2: 25 mmol/L (ref 22–32)
Calcium: 8.8 mg/dL — ABNORMAL LOW (ref 8.9–10.3)
Chloride: 104 mmol/L (ref 98–111)
Creatinine, Ser: 0.68 mg/dL (ref 0.44–1.00)
GFR calc Af Amer: >60 mL/min (ref >60)
GFR calc non Af Amer: >60 mL/min (ref >60)
GLUCOSE: 115 mg/dL — AB (ref 70–99)
POTASSIUM: 3.8 mmol/L (ref 3.5–5.1)
Sodium: 138 mmol/L (ref 135–145)
TOTAL PROTEIN: 7.1 g/dL (ref 6.5–8.1)

## 2018-03-26 LAB — CBC
HEMATOCRIT: 38.4 % (ref 36.0–46.0)
HEMOGLOBIN: 12.1 g/dL (ref 12.0–15.0)
MCH: 29.3 pg (ref 26.0–34.0)
MCHC: 31.5 g/dL (ref 30.0–36.0)
MCV: 93 fL (ref 78.0–100.0)
Platelets: 211 10*3/uL (ref 150–400)
RBC: 4.13 MIL/uL (ref 3.87–5.11)
RDW: 11.9 % (ref 11.5–15.5)
WBC: 7.6 10*3/uL (ref 4.0–10.5)

## 2018-03-26 LAB — PROTIME-INR
INR: 0.91
Prothrombin Time: 12.2 seconds (ref 11.4–15.2)

## 2018-03-26 MED ORDER — ALBUTEROL SULFATE (2.5 MG/3ML) 0.083% IN NEBU: 2.5000 mg | INHALATION_SOLUTION | Freq: Four times a day (QID) | RESPIRATORY_TRACT | Status: DC | PRN

## 2018-03-26 MED ORDER — NITROGLYCERIN 0.4 MG SL SUBL: 0.4000 mg | SUBLINGUAL_TABLET | SUBLINGUAL | Status: DC | PRN

## 2018-03-26 MED ORDER — OXYCODONE HCL 5 MG PO TABS
5.0000 mg | ORAL_TABLET | ORAL | Status: DC | PRN
  Administered 2018-03-26: 10 mg via ORAL
  Administered 2018-03-27 (×2): 5 mg via ORAL
  Administered 2018-03-27 (×2): 10 mg via ORAL
  Filled 2018-03-26 (×4): qty 2

## 2018-03-26 MED ORDER — NITROGLYCERIN 0.4 MG SL SUBL: 0.4000 mg | SUBLINGUAL_TABLET | Freq: Every day | SUBLINGUAL | Status: DC | PRN

## 2018-03-26 MED ORDER — ACETAMINOPHEN 650 MG RE SUPP: 650.0000 mg | Freq: Four times a day (QID) | RECTAL | Status: DC | PRN

## 2018-03-26 MED ORDER — SODIUM CHLORIDE 0.9 % IV SOLN
INTRAVENOUS | Status: DC
  Administered 2018-03-26 – 2018-03-27 (×4): via INTRAVENOUS

## 2018-03-26 MED ORDER — ESTROGENS CONJUGATED 0.625 MG PO TABS
0.6250 mg | ORAL_TABLET | Freq: Every day | ORAL | Status: DC
  Administered 2018-03-26 – 2018-03-30 (×4): 0.625 mg via ORAL
  Filled 2018-03-26 (×6): qty 1

## 2018-03-26 MED ORDER — ACETAMINOPHEN 325 MG PO TABS
650.0000 mg | ORAL_TABLET | Freq: Four times a day (QID) | ORAL | Status: DC | PRN
  Administered 2018-03-28 – 2018-03-30 (×4): 650 mg via ORAL
  Filled 2018-03-26 (×4): qty 2

## 2018-03-26 MED ORDER — HYDROMORPHONE HCL 1 MG/ML IJ SOLN
0.5000 mg | INTRAMUSCULAR | Status: DC | PRN
  Administered 2018-03-26: 0.5 mg via INTRAVENOUS
  Administered 2018-03-26: 1 mg via INTRAVENOUS
  Administered 2018-03-27: 0.5 mg via INTRAVENOUS
  Administered 2018-03-27 – 2018-03-30 (×22): 1 mg via INTRAVENOUS
  Filled 2018-03-26 (×25): qty 1

## 2018-03-26 NOTE — Progress Notes (Signed)
Patient has no Home meds ordered. Paging MD now for home med orders and VTE orders

## 2018-03-26 NOTE — Telephone Encounter (Signed)
Contacted by orthopedic service. Ms Krogh is to be admitted with fractures in both legs. She needs surgery tomorrow. She had normal coronaries in 2010. She had seen Dr Bettina Gavia recently in the office and coronary CTA is scheduled for August. Reviewed with Dr Gwenlyn Found. Based on urgency of surgery and prior normal coronaries pt is an acceptable risk for surgery without further cardiac work up. We will be available as needed.  Kerin Ransom PA-C 03/26/2018 4:07 PM

## 2018-03-26 NOTE — H&P (Signed)
PREOPERATIVE H&P  Chief Complaint:   HPI: Melissa Conley is a 57 y.o. female who presents for evaluation of Right ankle after a fall 2 days ago off of her deck.. It has been present for 2 days and has been worsening.She had x-rays taken which confirmed a displaced trimalleolar fracture on the right and a calcaneus fracture on the left.  She was placed in bilateral posterior splints.  Pain is rated as severe.  Past Medical History:  Diagnosis Date  . Acid reflux   . Angina at rest [facility])   . Anxiety   . Bulging lumbar disc 03/28/2016   Overview:  L4-5, left  . Chronic back pain   . Chronic pain syndrome 11/02/2015  . Common migraine 11/02/2015  . COPD (chronic obstructive pulmonary disease) ([place]) 01/28/2018  . Dysuria 01/31/2017  . Generalized anxiety disorder 01/28/2018  . GERD without esophagitis 01/28/2018  . H/O: hysterectomy 02/07/2017  . History of abnormal mammogram 12/20/2016  . IBS (irritable bowel syndrome)   . Lumbar radiculopathy 11/02/2015  . Menopausal disorder 01/05/2016  . Neck pain 11/02/2015  . Numbness and tingling of both legs below knees 02/10/2016  . Ovarian cancer ([place])   . Postmenopausal HRT (hormone replacement therapy) 01/31/2017  . 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 Thoracic back pain 11/02/2015  . Unstable angina ([place]) 01/28/2018   Past Surgical History:  Procedure Laterality Date  . ABDOMINAL HYSTERECTOMY    . CARDIAC CATHETERIZATION    . CHOLECYSTECTOMY    . LUMBAR FUSION  2000   Social History   Socioeconomic History  . Marital status: Single    Spouse name: Not on file  . Number of children: Not on file  . Years of education: Not on file  . Highest education level: Not on file  Occupational History  . Not on file  Social Needs  . Financial resource strain: Not on file  . Food insecurity:    Worry: Not on file    Inability: Not on file  . Transportation needs:    Medical: Not on file    Non-medical: Not on  file  Tobacco Use  . Smoking status: Former Smoker    Packs/day: 0.50    Years: 10.00    Pack years: 5.00  . Smokeless tobacco: Never Used  . Tobacco comment: quit 14 years ago   Substance and Sexual Activity  . Alcohol use: Not Currently  . Drug use: Not Currently  . Sexual activity: Not on file  Lifestyle  . Physical activity:    Days per week: Not on file    Minutes per session: Not on file  . Stress: Not on file  Relationships  . Social connections:    Talks on phone: Not on file    Gets together: Not on file    Attends religious service: Not on file    Active member of club or organization: Not on file    Attends meetings of clubs or organizations: Not on file    Relationship status: Not on file  Other Topics Concern  . Not on file  Social History Narrative  . Not on file   Family History  Problem Relation Age of Onset  . Heart attack Mother   . Lung cancer Mother   . Bone cancer Brother   . Lung cancer Brother    Allergies  Allergen Reactions  . Fluzone [Flu Virus Vaccine] Hives  . Penicillins Anaphylaxis and Hives  Has patient had a PCN reaction causing immediate rash, facial/tongue/throat swelling, SOB or lightheadedness with hypotension: Yes Has patient had a PCN reaction causing severe rash involving mucus membranes or skin necrosis: No Has patient had a PCN reaction that required hospitalization: Yes Has patient had a PCN reaction occurring within the last 10 years: Yes If all of the above answers are "NO", then may proceed with Cephalosporin use.    . Gabapentin Other (See Comments)    GI Upset    . Tapentadol Nausea Only  . Methocarbamol Nausea Only  . Morphine Nausea And Vomiting  . Tizanidine Nausea Only   Prior to Admission medications   Medication Sig Start Date End Date Taking? Authorizing Provider  albuterol (PROAIR HFA) 108 (90 Base) MCG/ACT inhaler Inhale 1 puff into the lungs 4 (four) times daily as needed for shortness of breath.      [provider]  aspirin EC 81 MG tablet Take 81 mg by mouth daily.    [provider]  cyanocobalamin (,VITAMIN B-12,) 1000 MCG/ML injection Inject 1 mL into the muscle every 30 (thirty) days. 09/22/15   [provider]  EPINEPHrine 0.3 mg/0.3 mL IJ SOAJ injection Inject 0.3 mg into the muscle as directed.    [provider]  estrogens, conjugated, (PREMARIN) 0.625 MG tablet Take 0.625 mg by mouth daily. 01/06/16   [provider]  fluticasone (FLONASE) 50 MCG/ACT nasal spray Place 1 spray into both nostrils daily as needed for allergies or rhinitis.     [provider]  loratadine (CLARITIN) 10 MG tablet Take 10 mg by mouth daily as needed for allergies.     [provider]  metoprolol tartrate (LOPRESSOR) 50 MG tablet Take 1 tablet (50 mg total) by mouth once for 1 dose. Take 1 tablet 1 hour prior to cardiac CTA. 02/06/18 02/06/18  Richardo Priest, MD  Multiple Vitamin (MULTIVITAMIN) tablet Take 1 tablet by mouth daily.    [provider]  nitroGLYCERIN (NITROSTAT) 0.4 MG SL tablet Place 0.4 mg every 5 (five) minutes as needed under the tongue for chest pain.    [provider]  oxyCODONE (ROXICODONE) 15 MG immediate release tablet Take 15 mg every 4 (four) hours as needed by mouth for pain.    [provider]  Polyethylene Glycol 3350 (PEG 3350) POWD Take by mouth. TAKE AS DIRECTED 12/10/12   [provider]     Positive ROS: Negative as relates to the orthopaedic condition  All other systems have been reviewed and were otherwise negative with the exception of those mentioned in the HPI and as above.  Physical Exam: See chart. General: Alert, no acute distress Cardiovascular: No pedal edema Respiratory: No cyanosis, no use of accessory musculature GI: No organomegaly, abdomen is soft and non-tender Skin: No lesions in the area of chief complaint Neurologic: Sensation intact  distally Psychiatric: Patient is competent for consent with normal mood and affect Lymphatic: No axillary or cervical lymphadenopathy  MUSCULOSKELETAL: Right lower extremity exam: She is in a posterior and U-splint.  The splint is removed.  She has significant swellingof the ankle.  DP pulse 2+.  No evidence of skin breakdown or fracture blisters. Left lower extremity exam: She is in a posterior splint.  Moves toes actively.  Imaging: She has a displaced trimalleolar fracture of the right ankle and a fracture of the left calcaneus.  Assessment/Plan: RIGHT ANKLE TRIMALLALOR FRACTURE Left calcaneus fracture. Plan for Procedure(s): OPEN REDUCTION INTERNAL FIXATION (ORIF) ANKLE  FRACTURE  On 03/27/18. We will obtain a CT scan of the left calcaneus to determine whether she needs surgical intervention on the left. We may consider consult with the orthopaedic trauma service regarding her left calcaneus. The risks benefits and alternatives were discussed with the patient including but not limited to the risks of nonoperative treatment, versus surgical intervention including infection, bleeding, nerve injury, malunion, nonunion, hardware prominence, hardware failure, need for hardware removal, blood clots, cardiopulmonary complications, morbidity, mortality, among others, and they were willing to proceed.  Predicted outcome is good, although there will be at least a six to nine month expected recovery.  Erlene Senters, PA-C 03/26/2018 5:40 PM

## 2018-03-26 NOTE — H&P (Deleted)
  The note originally documented on this encounter has been moved the the encounter in which it belongs.  

## 2018-03-27 ENCOUNTER — Inpatient Hospital Stay (HOSPITAL_COMMUNITY): Admission: RE | Admit: 2018-03-27 | Payer: Medicare Other | Source: Ambulatory Visit | Admitting: Orthopedic Surgery

## 2018-03-27 ENCOUNTER — Inpatient Hospital Stay (HOSPITAL_COMMUNITY): Payer: Medicare Other | Admitting: Anesthesiology

## 2018-03-27 ENCOUNTER — Encounter (HOSPITAL_COMMUNITY): Payer: Self-pay | Admitting: General Practice

## 2018-03-27 ENCOUNTER — Encounter (HOSPITAL_COMMUNITY)
Admission: AD | Disposition: A | Discharge status: Home Health | Payer: Self-pay | Source: Ambulatory Visit | Attending: Orthopedic Surgery

## 2018-03-27 ENCOUNTER — Inpatient Hospital Stay (HOSPITAL_COMMUNITY): Payer: Medicare Other

## 2018-03-27 ENCOUNTER — Other Ambulatory Visit: Payer: Self-pay

## 2018-03-27 HISTORY — PX: ORIF ANKLE FRACTURE: SHX5408

## 2018-03-27 LAB — CBC
HEMATOCRIT: 36.7 % (ref 36.0–46.0)
HEMOGLOBIN: 11.6 g/dL — AB (ref 12.0–15.0)
MCH: 29.5 pg (ref 26.0–34.0)
MCHC: 31.6 g/dL (ref 30.0–36.0)
MCV: 93.4 fL (ref 78.0–100.0)
Platelets: 241 10*3/uL (ref 150–400)
RBC: 3.93 MIL/uL (ref 3.87–5.11)
RDW: 11.6 % (ref 11.5–15.5)
WBC: 8.4 10*3/uL (ref 4.0–10.5)

## 2018-03-27 LAB — TYPE AND SCREEN
ABO/RH(D): O POS
Antibody Screen: NEGATIVE

## 2018-03-27 LAB — CREATININE, SERUM: Creatinine, Ser: 0.69 mg/dL (ref 0.44–1.00)

## 2018-03-27 LAB — APTT: aPTT: 31 seconds (ref 24–36)

## 2018-03-27 LAB — SURGICAL PCR SCREEN
MRSA, PCR: NEGATIVE
STAPHYLOCOCCUS AUREUS: NEGATIVE

## 2018-03-27 LAB — ABO/RH: ABO/RH(D): O POS

## 2018-03-27 SURGERY — OPEN REDUCTION INTERNAL FIXATION (ORIF) ANKLE FRACTURE
Anesthesia: General | Site: Ankle | Laterality: Right

## 2018-03-27 MED ORDER — ONDANSETRON HCL 4 MG/2ML IJ SOLN
INTRAMUSCULAR | Status: DC | PRN
  Administered 2018-03-27: 4 mg via INTRAVENOUS

## 2018-03-27 MED ORDER — LACTATED RINGERS IV SOLN
INTRAVENOUS | Status: DC
  Administered 2018-03-27 (×2): via INTRAVENOUS

## 2018-03-27 MED ORDER — MIDAZOLAM HCL 2 MG/2ML IJ SOLN
2.0000 mg | Freq: Once | INTRAMUSCULAR | Status: AC
  Administered 2018-03-27: 2 mg via INTRAVENOUS

## 2018-03-27 MED ORDER — ACETAMINOPHEN 325 MG PO TABS
ORAL_TABLET | ORAL | Status: AC
  Administered 2018-03-27: 650 mg
  Filled 2018-03-27: qty 2

## 2018-03-27 MED ORDER — SODIUM CHLORIDE 0.9 % IV SOLN
Freq: Once | INTRAVENOUS | Status: AC
  Administered 2018-03-27: 06:00:00 via INTRAVENOUS

## 2018-03-27 MED ORDER — ESTROGENS CONJUGATED 0.625 MG PO TABS: 0.6250 mg | ORAL_TABLET | Freq: Every day | ORAL | Status: DC

## 2018-03-27 MED ORDER — PROPOFOL 10 MG/ML IV BOLUS
INTRAVENOUS | Status: DC | PRN
  Administered 2018-03-27: 150 mg via INTRAVENOUS

## 2018-03-27 MED ORDER — OXYCODONE HCL 5 MG PO TABS
ORAL_TABLET | ORAL | Status: AC
  Filled 2018-03-27: qty 1

## 2018-03-27 MED ORDER — SODIUM CHLORIDE 0.9 % IV SOLN
INTRAVENOUS | Status: DC | PRN
  Administered 2018-03-27: 20 ug/min via INTRAVENOUS

## 2018-03-27 MED ORDER — PROMETHAZINE HCL 25 MG/ML IJ SOLN: 6.2500 mg | INTRAMUSCULAR | Status: DC | PRN

## 2018-03-27 MED ORDER — KETOROLAC TROMETHAMINE 30 MG/ML IJ SOLN: 30.0000 mg | Freq: Once | INTRAMUSCULAR | Status: DC | PRN

## 2018-03-27 MED ORDER — HYDROMORPHONE HCL 1 MG/ML IJ SOLN
0.2500 mg | INTRAMUSCULAR | Status: DC | PRN
  Administered 2018-03-27 (×2): 0.5 mg via INTRAVENOUS

## 2018-03-27 MED ORDER — DEXAMETHASONE SODIUM PHOSPHATE 4 MG/ML IJ SOLN
INTRAMUSCULAR | Status: DC | PRN
  Administered 2018-03-27: 10 mg via INTRAVENOUS

## 2018-03-27 MED ORDER — LIDOCAINE 2% (20 MG/ML) 5 ML SYRINGE
INTRAMUSCULAR | Status: DC | PRN
  Administered 2018-03-27: 80 mg via INTRAVENOUS

## 2018-03-27 MED ORDER — 0.9 % SODIUM CHLORIDE (POUR BTL) OPTIME
TOPICAL | Status: DC | PRN
  Administered 2018-03-27: 1000 mL

## 2018-03-27 MED ORDER — HYDROMORPHONE HCL 1 MG/ML IJ SOLN
INTRAMUSCULAR | Status: AC
  Filled 2018-03-27: qty 1

## 2018-03-27 MED ORDER — SODIUM CHLORIDE 0.9 % IV SOLN
INTRAVENOUS | Status: DC
Start: 2018-03-27 — End: 2018-03-30
  Administered 2018-03-27 – 2018-03-30 (×4): via INTRAVENOUS

## 2018-03-27 MED ORDER — NITROGLYCERIN 0.4 MG SL SUBL
0.4000 mg | SUBLINGUAL_TABLET | SUBLINGUAL | Status: DC | PRN
Start: 2018-03-27 — End: 2018-03-27

## 2018-03-27 MED ORDER — MIDAZOLAM HCL 2 MG/2ML IJ SOLN
INTRAMUSCULAR | Status: AC
  Administered 2018-03-27: 2 mg via INTRAVENOUS
  Filled 2018-03-27: qty 2

## 2018-03-27 MED ORDER — FENTANYL CITRATE (PF) 100 MCG/2ML IJ SOLN
INTRAMUSCULAR | Status: DC | PRN
  Administered 2018-03-27: 25 ug via INTRAVENOUS

## 2018-03-27 MED ORDER — PHENYLEPHRINE 40 MCG/ML (10ML) SYRINGE FOR IV PUSH (FOR BLOOD PRESSURE SUPPORT)
PREFILLED_SYRINGE | INTRAVENOUS | Status: DC | PRN
  Administered 2018-03-27 (×4): 80 ug via INTRAVENOUS

## 2018-03-27 MED ORDER — VANCOMYCIN HCL IN DEXTROSE 1-5 GM/200ML-% IV SOLN
1000.0000 mg | INTRAVENOUS | Status: AC
  Administered 2018-03-27: 1000 mg via INTRAVENOUS
  Filled 2018-03-27 (×2): qty 200

## 2018-03-27 MED ORDER — ONDANSETRON HCL 4 MG/2ML IJ SOLN
4.0000 mg | Freq: Four times a day (QID) | INTRAMUSCULAR | Status: DC | PRN
  Administered 2018-03-28 – 2018-03-30 (×5): 4 mg via INTRAVENOUS
  Filled 2018-03-27 (×5): qty 2

## 2018-03-27 MED ORDER — CHLORHEXIDINE GLUCONATE 4 % EX LIQD: 60.0000 mL | Freq: Once | CUTANEOUS | Status: DC

## 2018-03-27 MED ORDER — ALBUTEROL SULFATE HFA 108 (90 BASE) MCG/ACT IN AERS: 1.0000 | INHALATION_SPRAY | Freq: Four times a day (QID) | RESPIRATORY_TRACT | Status: DC | PRN

## 2018-03-27 MED ORDER — FENTANYL CITRATE (PF) 100 MCG/2ML IJ SOLN
INTRAMUSCULAR | Status: AC
  Administered 2018-03-27: 50 ug via INTRAVENOUS
  Filled 2018-03-27: qty 2

## 2018-03-27 MED ORDER — ONDANSETRON HCL 4 MG PO TABS
4.0000 mg | ORAL_TABLET | Freq: Four times a day (QID) | ORAL | Status: DC | PRN
  Administered 2018-03-28: 4 mg via ORAL
  Filled 2018-03-27: qty 1

## 2018-03-27 MED ORDER — BUPIVACAINE-EPINEPHRINE (PF) 0.5% -1:200000 IJ SOLN
INTRAMUSCULAR | Status: DC | PRN
  Administered 2018-03-27 (×2): 20 mL via PERINEURAL

## 2018-03-27 MED ORDER — FENTANYL CITRATE (PF) 100 MCG/2ML IJ SOLN
50.0000 ug | Freq: Once | INTRAMUSCULAR | Status: AC
  Administered 2018-03-27: 50 ug via INTRAVENOUS

## 2018-03-27 MED ORDER — BUPIVACAINE HCL (PF) 0.25 % IJ SOLN
INTRAMUSCULAR | Status: DC | PRN
  Administered 2018-03-27: 30 mL

## 2018-03-27 MED ORDER — ENOXAPARIN SODIUM 40 MG/0.4ML ~~LOC~~ SOLN
40.0000 mg | SUBCUTANEOUS | Status: DC
  Administered 2018-03-28 – 2018-03-30 (×3): 40 mg via SUBCUTANEOUS
  Filled 2018-03-27 (×3): qty 0.4

## 2018-03-27 MED ORDER — DOCUSATE SODIUM 100 MG PO CAPS
100.0000 mg | ORAL_CAPSULE | Freq: Two times a day (BID) | ORAL | Status: DC
  Administered 2018-03-27 – 2018-03-30 (×6): 100 mg via ORAL
  Filled 2018-03-27 (×6): qty 1

## 2018-03-27 MED ORDER — CLINDAMYCIN PHOSPHATE 600 MG/50ML IV SOLN
600.0000 mg | Freq: Four times a day (QID) | INTRAVENOUS | Status: AC
  Administered 2018-03-27 – 2018-03-28 (×3): 600 mg via INTRAVENOUS
  Filled 2018-03-27 (×3): qty 50

## 2018-03-27 SURGICAL SUPPLY — 69 items
BANDAGE ACE 4X5 VEL STRL LF (GAUZE/BANDAGES/DRESSINGS) ×1 IMPLANT
BANDAGE ACE 6X5 VEL STRL LF (GAUZE/BANDAGES/DRESSINGS) ×1 IMPLANT
BANDAGE ESMARK 6X9 LF (GAUZE/BANDAGES/DRESSINGS) ×1 IMPLANT
BIT DRILL 2.7 (BIT) ×1
BIT DRILL 2.7MM (BIT) IMPLANT
BIT DRILL 2.7XCANN QCK CNCT (BIT) IMPLANT
BIT DRILL CANN 2.7 (BIT) ×2
BIT DRL 2.7XCANN QCK CNCT (BIT) ×1
BLADE CLIPPER SURG (BLADE) IMPLANT
BNDG CMPR 9X6 STRL LF SNTH (GAUZE/BANDAGES/DRESSINGS) ×1
BNDG CMPR MED 10X6 ELC LF (GAUZE/BANDAGES/DRESSINGS) ×1
BNDG ELASTIC 6X10 VLCR STRL LF (GAUZE/BANDAGES/DRESSINGS) ×1 IMPLANT
BNDG ESMARK 6X9 LF (GAUZE/BANDAGES/DRESSINGS) ×2
CANISTER SUCTION WELLS/JOHNSON (MISCELLANEOUS) ×1 IMPLANT
COVER MAYO STAND STRL (DRAPES) ×1 IMPLANT
COVER SURGICAL LIGHT HANDLE (MISCELLANEOUS) ×2 IMPLANT
CUFF TOURNIQUET SINGLE 18IN (TOURNIQUET CUFF) ×1 IMPLANT
CUFF TOURNIQUET SINGLE 34IN LL (TOURNIQUET CUFF) IMPLANT
CUFF TOURNIQUET SINGLE 44IN (TOURNIQUET CUFF) IMPLANT
DRAPE OEC MINIVIEW 54X84 (DRAPES) IMPLANT
DRAPE U-SHAPE 47X51 STRL (DRAPES) ×2 IMPLANT
DRILL BIT 2.7MM (BIT) ×2
DURAPREP 26ML APPLICATOR (WOUND CARE) ×2 IMPLANT
ELECT REM PT RETURN 9FT ADLT (ELECTROSURGICAL) ×2
ELECTRODE REM PT RTRN 9FT ADLT (ELECTROSURGICAL) ×1 IMPLANT
GAUZE SPONGE 4X4 12PLY STRL (GAUZE/BANDAGES/DRESSINGS) ×1 IMPLANT
GAUZE XEROFORM 1X8 LF (GAUZE/BANDAGES/DRESSINGS) IMPLANT
GAUZE XEROFORM 5X9 LF (GAUZE/BANDAGES/DRESSINGS) ×1 IMPLANT
GLOVE BIOGEL PI IND STRL 8 (GLOVE) ×2 IMPLANT
GLOVE BIOGEL PI INDICATOR 8 (GLOVE) ×2
GLOVE ECLIPSE 7.5 STRL STRAW (GLOVE) ×4 IMPLANT
GOWN STRL REUS W/ TWL LRG LVL3 (GOWN DISPOSABLE) ×1 IMPLANT
GOWN STRL REUS W/ TWL XL LVL3 (GOWN DISPOSABLE) ×2 IMPLANT
GOWN STRL REUS W/TWL LRG LVL3 (GOWN DISPOSABLE) ×2
GOWN STRL REUS W/TWL XL LVL3 (GOWN DISPOSABLE) ×6
KIT BASIN OR (CUSTOM PROCEDURE TRAY) ×2 IMPLANT
KIT TURNOVER KIT B (KITS) ×2 IMPLANT
MANIFOLD NEPTUNE II (INSTRUMENTS) ×1 IMPLANT
NS IRRIG 1000ML POUR BTL (IV SOLUTION) ×2 IMPLANT
PACK ORTHO EXTREMITY (CUSTOM PROCEDURE TRAY) ×2 IMPLANT
PAD ARMBOARD 7.5X6 YLW CONV (MISCELLANEOUS) ×3 IMPLANT
PAD CAST 4YDX4 CTTN HI CHSV (CAST SUPPLIES) IMPLANT
PADDING CAST COTTON 4X4 STRL (CAST SUPPLIES) ×2
PADDING CAST COTTON 6X4 STRL (CAST SUPPLIES) ×1 IMPLANT
PLATE FIBULA DISTAL 6H RIGHT (Plate) ×1 IMPLANT
SCREW 3.5X16MM (Screw) ×2 IMPLANT
SCREW BN 2.7X16X3.5XST NS (Screw) IMPLANT
SCREW CANNULATED 4.0MMX50MM (Screw) ×2 IMPLANT
SCREW CANNULATED 4.0X50 (Screw) IMPLANT
SCREW LOCK 12X3.5X2.7X NS (Screw) IMPLANT
SCREW LOCK 16X3.5X2.7X NS (Screw) IMPLANT
SCREW LOCK 18X3.5X2.7X NS (Screw) IMPLANT
SCREW LOCKING 3.5X12 (Screw) ×2 IMPLANT
SCREW LOCKING 3.5X16MM (Screw) ×2 IMPLANT
SCREW LOCKING 3.5X18MM (Screw) ×2 IMPLANT
SCREW PERI 3.5X14MM W/2.7 (Screw) ×1 IMPLANT
SCREW PERI 3.5X2.7X14 (Screw) ×3 IMPLANT
SPONGE LAP 4X18 RFD (DISPOSABLE) ×3 IMPLANT
STAPLER VISISTAT 35W (STAPLE) ×1 IMPLANT
SUCTION FRAZIER HANDLE 10FR (MISCELLANEOUS) ×1
SUCTION TUBE FRAZIER 10FR DISP (MISCELLANEOUS) ×1 IMPLANT
SUT ETHILON 4 0 PS 2 18 (SUTURE) IMPLANT
SUT VIC AB 0 CTB1 27 (SUTURE) ×1 IMPLANT
SUT VIC AB 2-0 FS1 27 (SUTURE) ×1 IMPLANT
SYR CONTROL 10ML LL (SYRINGE) IMPLANT
TOWEL OR 17X24 6PK STRL BLUE (TOWEL DISPOSABLE) ×1 IMPLANT
TOWEL OR 17X26 10 PK STRL BLUE (TOWEL DISPOSABLE) ×2 IMPLANT
TUBE CONNECTING 12X1/4 (SUCTIONS) ×2 IMPLANT
WATER STERILE IRR 1000ML POUR (IV SOLUTION) ×1 IMPLANT

## 2018-03-27 NOTE — Anesthesia Procedure Notes (Signed)
Procedure Name: LMA Insertion Date/Time: 03/27/2018 2:50 PM Performed by: Candis Shine, CRNA Pre-anesthesia Checklist: Patient identified, Emergency Drugs available, Suction available and Patient being monitored Patient Re-evaluated:Patient Re-evaluated prior to induction Oxygen Delivery Method: Circle System Utilized Preoxygenation: Pre-oxygenation with 100% oxygen Induction Type: IV induction Ventilation: Mask ventilation without difficulty LMA: LMA inserted LMA Size: 4.0 Number of attempts: 1 Placement Confirmation: positive ETCO2 Tube secured with: Tape Dental Injury: Teeth and Oropharynx as per pre-operative assessment

## 2018-03-27 NOTE — Brief Op Note (Signed)
03/27/2018  4:54 PM  PATIENT:  Melissa Conley  57 y.o. female  PRE-OPERATIVE DIAGNOSIS:  RIGHT ANKLE TRIMALLALOR FRACTURE  POST-OPERATIVE DIAGNOSIS:  RIGHT ANKLE TRIMALLALOR FRACTURE  PROCEDURE:  Procedure(s): OPEN REDUCTION INTERNAL FIXATION (ORIF) ANKLE FRACTURE (Right)  SURGEON:  Surgeon(s) and Role:    Dorna Leitz, MD - Primary  PHYSICIAN ASSISTANT:   ASSISTANTS: jim [email or web address]   ANESTHESIA:   general  EBL:  20 mL   BLOOD ADMINISTERED:none  DRAINS: none   LOCAL MEDICATIONS USED:  NONE  SPECIMEN:  No Specimen  DISPOSITION OF SPECIMEN:  N/A  COUNTS:  YES  TOURNIQUET:   Total Tourniquet Time Documented: Calf (Right) - 83 minutes Total: Calf (Right) - 83 minutes   DICTATION: .Other Dictation: Dictation Number [id number]  PLAN OF CARE: Admit to inpatient   PATIENT DISPOSITION:  PACU - hemodynamically stable.   Delay start of Pharmacological VTE agent (>24hrs) due to surgical blood loss or risk of bleeding: no

## 2018-03-27 NOTE — Progress Notes (Signed)
BP 89/69. Paged MD. Orders received for 500ML Bolus .

## 2018-03-27 NOTE — Anesthesia Procedure Notes (Signed)
Anesthesia Regional Block: Popliteal block   Pre-Anesthetic Checklist: ,, timeout performed, Correct Patient, Correct Site, Correct Laterality, Correct Procedure, Correct Position, site marked, Risks and benefits discussed,  Surgical consent,  Pre-op evaluation,  At surgeon's request and post-op pain management  Laterality: Right  Prep: chloraprep       Needles:  Injection technique: Single-shot  Needle Type: Echogenic Needle     Needle Length: 9cm  Needle Gauge: 21     Additional Needles:   Procedures:,,,, ultrasound used (permanent image in chart),,,,  Narrative:  Start time: 03/27/2018 2:05 PM End time: 03/27/2018 2:15 PM Injection made incrementally with aspirations every 5 mL.  Performed by: Personally  Anesthesiologist: Effie Berkshire, MD  Additional Notes: Patient tolerated the procedure well. Local anesthetic introduced in an incremental fashion under minimal resistance after negative aspirations. No paresthesias were elicited. After completion of the procedure, no acute issues were identified and patient continued to be monitored by RN.

## 2018-03-27 NOTE — Anesthesia Preprocedure Evaluation (Addendum)
Anesthesia Evaluation  Patient identified by MRN, date of birth, ID band Patient awake    Reviewed: Allergy & Precautions, NPO status , Patient's Chart, lab work & pertinent test results  Airway Mallampati: I  TM Distance: >3 FB Neck ROM: Full    Dental no notable dental hx. (+) Teeth Intact, Missing, Dental Advisory Given,    Pulmonary COPD,  COPD inhaler, former smoker,    Pulmonary exam normal breath sounds clear to auscultation       Cardiovascular Normal cardiovascular exam Rhythm:Regular Rate:Normal     Neuro/Psych  Headaches, PSYCHIATRIC DISORDERS Anxiety  Neuromuscular disease negative neurological ROS  negative psych ROS   GI/Hepatic Neg liver ROS, GERD  ,  Endo/Other  negative endocrine ROS  Renal/GU negative Renal ROS  negative genitourinary   Musculoskeletal  (+) Arthritis ,   Abdominal Normal abdominal exam  (+)   Peds negative pediatric ROS (+)  Hematology negative hematology ROS (+)   Anesthesia Other Findings   Reproductive/Obstetrics negative OB ROS                           Anesthesia Physical Anesthesia Plan  ASA: II  Anesthesia Plan: General   Post-op Pain Management: GA combined w/ Regional for post-op pain   Induction: Intravenous  PONV Risk Score and Plan: 3 and Ondansetron, Dexamethasone, Midazolam and Treatment may vary due to age or medical condition  Airway Management Planned: LMA  Additional Equipment:   Intra-op Plan:   Post-operative Plan: Extubation in OR  Informed Consent: I have reviewed the patients History and Physical, chart, labs and discussed the procedure including the risks, benefits and alternatives for the proposed anesthesia with the patient or authorized representative who has indicated his/her understanding and acceptance.   Dental advisory given  Plan Discussed with: CRNA and Surgeon  Anesthesia Plan Comments:         Anesthesia Quick Evaluation

## 2018-03-27 NOTE — Progress Notes (Signed)
Patient retaining urine 818ml. Paged Gary Fleet. Orders received for Foley insertion .

## 2018-03-27 NOTE — Transfer of Care (Signed)
Immediate Anesthesia Transfer of Care Note  Patient: Melissa Conley  Procedure(s) Performed: OPEN REDUCTION INTERNAL FIXATION (ORIF) ANKLE FRACTURE (Right Ankle)  Patient Location: PACU  Anesthesia Type:GA combined with regional for post-op pain  Level of Consciousness: awake, alert  and oriented  Airway & Oxygen Therapy: Patient Spontanous Breathing and Patient connected to nasal cannula oxygen  Post-op Assessment: Report given to RN and Post -op Vital signs reviewed and stable  Post vital signs: Reviewed and stable  Last Vitals:  Vitals Value Taken Time  BP 104/71 03/27/2018  4:46 PM  Temp    Pulse 92 03/27/2018  4:48 PM  Resp 13 03/27/2018  4:48 PM  SpO2 95 % 03/27/2018  4:48 PM  Vitals shown include unvalidated device data.  Last Pain:  Vitals:   03/27/18 1425  TempSrc:   PainSc: 0-No pain      Patients Stated Pain Goal: 3 (67/01/41 0301)  Complications: No apparent anesthesia complications

## 2018-03-27 NOTE — Anesthesia Procedure Notes (Addendum)
Anesthesia Regional Block: Adductor canal block   Pre-Anesthetic Checklist: ,, timeout performed, Correct Patient, Correct Site, Correct Laterality, Correct Procedure, Correct Position, site marked, Risks and benefits discussed,  Surgical consent,  Pre-op evaluation,  At surgeon's request and post-op pain management  Laterality: Right  Prep: chloraprep       Needles:  Injection technique: Single-shot  Needle Type: Echogenic Needle     Needle Length: 9cm  Needle Gauge: 21     Additional Needles:   Procedures:,,,, ultrasound used (permanent image in chart),,,,  Narrative:  Start time: 03/27/2018 2:15 PM End time: 03/27/2018 2:20 PM Injection made incrementally with aspirations every 5 mL.  Performed by: Personally  Anesthesiologist: Effie Berkshire, MD  Additional Notes: Patient tolerated the procedure well. Local anesthetic introduced in an incremental fashion under minimal resistance after negative aspirations. No paresthesias were elicited. After completion of the procedure, no acute issues were identified and patient continued to be monitored by RN.

## 2018-03-27 NOTE — Anesthesia Postprocedure Evaluation (Signed)
Anesthesia Post Note  Patient: Melissa Conley  Procedure(s) Performed: OPEN REDUCTION INTERNAL FIXATION (ORIF) ANKLE FRACTURE (Right Ankle)     Patient location during evaluation: PACU Anesthesia Type: General Level of consciousness: awake and alert Pain management: pain level controlled Vital Signs Assessment: post-procedure vital signs reviewed and stable Respiratory status: spontaneous breathing, nonlabored ventilation, respiratory function stable and patient connected to nasal cannula oxygen Cardiovascular status: blood pressure returned to baseline and stable Postop Assessment: no apparent nausea or vomiting Anesthetic complications: no Comments: No RLE pain per patient.     Last Vitals:  Vitals:   03/27/18 1700 03/27/18 1715  BP: 92/62 (!) 93/59  Pulse: 85   Resp: 12   Temp:    SpO2: 94%     Last Pain:  Vitals:   03/27/18 1715  TempSrc:   PainSc: 6    Left leg pain only.              Effie Berkshire

## 2018-03-28 ENCOUNTER — Encounter (HOSPITAL_COMMUNITY): Payer: Self-pay | Admitting: Orthopedic Surgery

## 2018-03-28 DIAGNOSIS — S82851A Displaced trimalleolar fracture of right lower leg, initial encounter for closed fracture: Principal | ICD-10-CM

## 2018-03-28 DIAGNOSIS — S92002A Unspecified fracture of left calcaneus, initial encounter for closed fracture: Secondary | ICD-10-CM

## 2018-03-28 DIAGNOSIS — M961 Postlaminectomy syndrome, not elsewhere classified: Secondary | ICD-10-CM

## 2018-03-28 MED ORDER — OXYCODONE HCL ER 20 MG PO T12A
20.0000 mg | EXTENDED_RELEASE_TABLET | Freq: Two times a day (BID) | ORAL | Status: DC
  Administered 2018-03-28 – 2018-03-30 (×5): 20 mg via ORAL
  Filled 2018-03-28 (×5): qty 1

## 2018-03-28 NOTE — Evaluation (Signed)
Occupational Therapy Evaluation Patient Details Name: Melissa Conley MRN: [id number] DOB: 05-21-1961 Today's Date: 03/28/2018    History of Present Illness 57 yo female who fell several days ago and was seen at [facility] and sent home to f/u as OP; She is s/p open reduction internal fixation of right trimalleolar ankle fracture, closed treatment of comminuted intra-articular calcaneus fracture. PMH signficant for but not limited to: COPD, chronic pain, spondylosis, anxiety, IBS, lumbar fusion.    Clinical Impression   PTA patient reports independent with ADL, IADL, mobility.  She was admitted for above dx and is currently significantly limited by pain, NWB B LEs, and generalized weakness.  At this time, she requires setup to min assist for seated UB ADL, max to total assist for LB ADL from bed level (due to pain), and completed simulated a-p transfer from recliner to bed with minA +2 (due to increased pain). Educated on precautions, safety, and modified techniques for ADL participation.  If discharged home, she would need 24/7 assistance and 3:1 drop arm commode completing all tasks from w/c level and HHOT, but believe patient would benefit from intensive CIR in order to maximize independence and safety prior to dc home with support.     Follow Up Recommendations  CIR;Supervision/Assistance - 24 hour(if dc home- HHOT )    Equipment Recommendations  3 in 1 bedside commode(drop arm 3:1 commode)    Recommendations for Other Services Rehab consult     Precautions / Restrictions Precautions Precautions: Fall Required Braces or Orthoses: Other Brace/Splint Other Brace/Splint: post op splints B LEs Restrictions Weight Bearing Restrictions: Yes RLE Weight Bearing: Non weight bearing LLE Weight Bearing: Non weight bearing      Mobility Bed Mobility Overal bed mobility: Needs Assistance Bed Mobility: Rolling;Sit to Sidelying Rolling: Min guard   Supine to sit: Min assist   Sit to  sidelying: Min guard General bed mobility comments: cueing for safety, limited by pain   Transfers Overall transfer level: Needs assistance Equipment used: None Transfers: Comptroller transfers: Min assist;+2 safety/equipment   General transfer comment: 2+ assist for safety due to increased pain during transfer, A B LEs and steady assist at trunk (multiple rest breaks due to fatigue and pain)    Balance Overall balance assessment: Needs assistance;History of Falls Sitting-balance support: Bilateral upper extremity supported;Feet unsupported Sitting balance-Leahy Scale: Good         Standing balance comment: n/a                           ADL either performed or assessed with clinical judgement   ADL Overall ADL's : Needs assistance/impaired Eating/Feeding: Supervision/ safety;Set up;Sitting   Grooming: Supervision/safety;Set up;Sitting   Upper Body Bathing: Minimal assistance;Bed level;Cueing for compensatory techniques;Cueing for safety   Lower Body Bathing: Maximal assistance;Bed level(limited by pain)   Upper Body Dressing : Minimal assistance;Bed level   Lower Body Dressing: Total assistance;Bed level Lower Body Dressing Details (indicate cue type and reason): limited by pain, NWB BLEs  Toilet Transfer: Anterior/posterior;Minimal assistance;+2 for safety/equipment;BSC(simulated from recliner ) Toilet Transfer Details (indicate cue type and reason): requires +2 due to increased pain for safety to ensure NWB to B LES (daughter assisting)       Clinical cytogeneticist Details (indicate cue type and reason): NA at this time Functional mobility during ADLs: Minimal assistance;+2 for safety/equipment(+2 for pain for recliner to wc transfers) General ADL Comments: signficantly  limited by pain this session      Vision   Vision Assessment?: No apparent visual deficits     Perception     Praxis      Pertinent  Vitals/Pain Pain Assessment: Faces Pain Score: 7  Faces Pain Scale: Hurts whole lot Pain Location: B lower legs Pain Descriptors / Indicators: Guarding;Grimacing;Discomfort;Moaning;Crying(increased pain after transfer from recliner to bed ) Pain Intervention(s): Limited activity within patient's tolerance;Repositioned;Premedicated before session;Patient requesting pain meds-RN notified     Hand Dominance Right   Extremity/Trunk Assessment Upper Extremity Assessment Upper Extremity Assessment: RUE deficits/detail;Generalized weakness RUE Deficits / Details: WFL AROM, reports tendonitis L elbow with "popping" if in full extension  RUE Sensation: WNL RUE Coordination: WNL   Lower Extremity Assessment Lower Extremity Assessment: Defer to PT evaluation RLE Deficits / Details: knee and hip AAROM grossly to 90*/90*, muscle guarding limiting full ROM RLE: Unable to fully assess due to immobilization LLE Deficits / Details: knee and hip AAROM grossly to 90*/90*, muscle guarding limiting full ROM LLE: Unable to fully assess due to pain;Unable to fully assess due to immobilization       Communication Communication Communication: No difficulties   Cognition Arousal/Alertness: Awake/alert Behavior During Therapy: WFL for tasks assessed/performed Overall Cognitive Status: Within Functional Limits for tasks assessed                                     General Comments  daughter present and supportive    Exercises     Shoulder Instructions      Home Living Family/patient expects to be discharged to:: Private residence Living Arrangements: Alone Available Help at Discharge: Family;Available 24 hours/day Type of Home: Mobile home Home Access: Stairs to enter Entrance Stairs-Number of Steps: 4   Home Layout: One level         Bathroom Toilet: Standard Bathroom Accessibility: No(WC will not fit in bathroom)   Home Equipment: None   Additional Comments: Pt  reports she will have 24/7 assistance at home.       Prior Functioning/Environment Level of Independence: Independent        Comments: ADL, IADL and mobility, was driving         OT Problem List: Decreased strength;Decreased activity tolerance;Decreased knowledge of use of DME or AE;Pain      OT Treatment/Interventions: Self-care/ADL training;Therapeutic exercise;Patient/family education;Therapeutic activities;DME and/or AE instruction;Energy conservation    OT Goals(Current goals can be found in the care plan section) Acute Rehab OT Goals Patient Stated Goal: to get home  OT Goal Formulation: With patient Time For Goal Achievement: 04/11/18 Potential to Achieve Goals: Good  OT Frequency: Min 2X/week   Barriers to D/C:            Co-evaluation              AM-PAC PT "6 Clicks" Daily Activity     Outcome Measure Help from another person eating meals?: None Help from another person taking care of personal grooming?: None Help from another person toileting, which includes using toliet, bedpan, or urinal?: A Lot Help from another person bathing (including washing, rinsing, drying)?: A Lot Help from another person to put on and taking off regular upper body clothing?: None Help from another person to put on and taking off regular lower body clothing?: Total 6 Click Score: 17   End of Session Equipment Utilized During Treatment: Gait belt Nurse Communication: Mobility  status;Patient requests pain meds  Activity Tolerance: Patient limited by pain;Patient limited by fatigue Patient left: in bed;with family/visitor present;with call bell/phone within reach  OT Visit Diagnosis: Other abnormalities of gait and mobility (R26.89);Muscle weakness (generalized) (M62.81);Pain Pain - Right/Left: (left and right) Pain - part of body: Leg;Ankle and joints of foot                Time: 8335-8251 OT Time Calculation (min): 22 min Charges:  OT General Charges $OT Visit: 1  Visit OT Evaluation $OT Eval Moderate Complexity: 1 Mod G-Codes:     Delight Stare, OTR/L  Pager [place] 03/28/2018, 2:46 PM

## 2018-03-28 NOTE — Progress Notes (Signed)
Rehab Admissions Coordinator Note:  Patient was screened by Cleatrice Burke for appropriateness for an Inpatient Acute Rehab Consult per PT recommendation.  At this time, we are recommending [place]. Do not feel pt will meet the medical neccesity for hospital acute rehab at this time. Please call with any questions.  Cleatrice Burke 03/28/2018, 1:23 PM  I can be reached at [phone].

## 2018-03-28 NOTE — Consult Note (Signed)
Physical Medicine and Rehabilitation Consult   Reason for Consult: Bilateral ankle fracture.  Referring Physician: Dr. Berenice Primas.    HPI: Melissa Conley is a 57 y.o. female with history of COPD, cancer, lumbar spondylosis s/p multiple back surgeries with BLE neuropathy and chronic LBP who was sustained fall 2 days PTA on 03/26/18  Reports of ankle pain. She was found to have comminuted left calcaneus fracture and trimalleolar right ankle fracture and underwent ORIF right ankle fracture with closed reduction of left ankle on 07/17 by Dr. Berenice Primas. Post op to be NWB BLE --on lovenox for DVT prophylaxis with recommendations to transition patient to Eliquis at discharge. Therapy evaluations done today and CIR recommended due to functional deficits.  Patient live alone but was independent without AD. Daughter plans on providing assistance after discharge.  Patient has chronic right elbow pain and states she cannot push up with her elbow on the right side.,  Requiring 2 person assist for transfers currently  Review of Systems  Constitutional: Negative for chills and fever.  HENT: Negative for hearing loss and tinnitus.   Eyes: Negative for blurred vision and double vision.  Respiratory: Negative for cough and shortness of breath.   Cardiovascular: Negative for chest pain and palpitations.  Gastrointestinal: Positive for constipation (chronic--BM every 2 weeks. ) and heartburn. Negative for nausea.  Musculoskeletal: Positive for back pain, joint pain and myalgias.  Neurological: Positive for sensory change. Negative for weakness.  Psychiatric/Behavioral: The patient is nervous/anxious.     Past Medical History:  Diagnosis Date  . Acid reflux   . Angina at rest [facility])   . Anxiety    hx (03/26/2018)  . Bulging lumbar disc 03/28/2016   Overview:  L4-5, left  . Chronic bronchitis ([place])   . Chronic lower back pain   . Chronic pain syndrome 11/02/2015  . Common migraine    "monthly"  (03/26/2018)  . COPD (chronic obstructive pulmonary disease) ([place]) 01/28/2018  . Dysuria 01/31/2017  . Fall from high place 03/25/2018   "deck; fell ~ 9ft; broke both feet"  . Frequent headaches    "qod" (03/26/2018)  . GERD without esophagitis 01/28/2018  . History of abnormal mammogram 12/20/2016  . IBS (irritable bowel syndrome)   . Lumbar radiculopathy 11/02/2015  . Menopausal disorder 01/05/2016  . Neck pain 11/02/2015  . Numbness and tingling of both legs below knees 02/10/2016  . Ovarian cancer ([place])   . Pneumonia    "twice" (03/26/2018)  . Postmenopausal HRT (hormone replacement therapy) 01/31/2017  . 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 Thoracic back pain 11/02/2015  . Unstable angina ([place]) 01/28/2018    Past Surgical History:  Procedure Laterality Date  . BACK SURGERY    . CARDIAC CATHETERIZATION    . CHOLECYSTECTOMY OPEN    . DILATION AND CURETTAGE OF UTERUS    . POSTERIOR LUMBAR FUSION  2000  . TUBAL LIGATION    . VAGINAL HYSTERECTOMY      Family History  Problem Relation Age of Onset  . Heart attack Mother   . Lung cancer Mother   . Bone cancer Brother   . Lung cancer Brother     Social History:  Lives alone. Disabled but independent PTA. She  reports that she quit smoking about 9 years ago. She has a 45.00 pack-year smoking history. She has never used smokeless tobacco. She reports that she drank alcohol. She reports that she does not use drugs.  Allergies  Allergen Reactions  . Fluzone [Flu Virus Vaccine] Hives  . Penicillins Anaphylaxis and Hives    Has patient had a PCN reaction causing immediate rash, facial/tongue/throat swelling, SOB or lightheadedness with hypotension: Yes Has patient had a PCN reaction causing severe rash involving mucus membranes or skin necrosis: No Has patient had a PCN reaction that required hospitalization: Yes Has patient had a PCN reaction occurring within the last 10 years: Yes If all of the  above answers are "NO", then may proceed with Cephalosporin use.    . Gabapentin Other (See Comments)    GI Upset    . Tapentadol Nausea Only  . Methocarbamol Nausea Only  . Morphine Nausea And Vomiting    Pt reports that she is not allergic to this medication  . Tizanidine Nausea Only    Medications Prior to Admission  Medication Sig Dispense Refill  . albuterol (PROAIR HFA) 108 (90 Base) MCG/ACT inhaler Inhale 1 puff into the lungs 4 (four) times daily as needed for shortness of breath.     Marland Kitchen aspirin EC 81 MG tablet Take 81 mg by mouth daily.    . cyanocobalamin (,VITAMIN B-12,) 1000 MCG/ML injection Inject 1 mL into the muscle every 30 (thirty) days.    Marland Kitchen EPINEPHrine 0.3 mg/0.3 mL IJ SOAJ injection Inject 0.3 mg into the muscle as directed.    . estrogens, conjugated, (PREMARIN) 0.625 MG tablet Take 0.625 mg by mouth daily.    . fluticasone (FLONASE) 50 MCG/ACT nasal spray Place 1 spray into both nostrils daily as needed for allergies or rhinitis.     Marland Kitchen loratadine (CLARITIN) 10 MG tablet Take 10 mg by mouth daily as needed for allergies.     . Multiple Vitamin (MULTIVITAMIN) tablet Take 1 tablet by mouth daily.    . nitroGLYCERIN (NITROSTAT) 0.4 MG SL tablet Place 0.4 mg every 5 (five) minutes as needed under the tongue for chest pain.    Marland Kitchen oxyCODONE (ROXICODONE) 15 MG immediate release tablet Take 15 mg every 4 (four) hours as needed by mouth for pain.    . metoprolol tartrate (LOPRESSOR) 50 MG tablet Take 1 tablet (50 mg total) by mouth once for 1 dose. Take 1 tablet 1 hour prior to cardiac CTA. 1 tablet 0    Home: Home Living Family/patient expects to be discharged to:: Private residence Living Arrangements: Alone Available Help at Discharge: Family, Available 24 hours/day Type of Home: Mobile home Home Access: Stairs to enter [employer] of Steps: 4 Home Layout: One level Bathroom Toilet: Standard Bathroom Accessibility: No(WC will not fit in bathroom) Home  Equipment: None Additional Comments: Pt reports she will have 24/7 assistance at home.   Functional History: Prior Function Level of Independence: Independent Comments: ADL, IADL and mobility, was driving  Functional Status:  Mobility: Bed Mobility Overal bed mobility: Needs Assistance Bed Mobility: Rolling, Sit to Sidelying Rolling: Min guard Supine to sit: Min assist Sit to sidelying: Min guard General bed mobility comments: cueing for safety, limited by pain  Transfers Overall transfer level: Needs assistance Equipment used: None Transfers: Government social research officer transfers: Min assist, +2 safety/equipment General transfer comment: 2+ assist for safety due to increased pain during transfer, A B LEs and steady assist at trunk (multiple rest breaks due to fatigue and pain) Ambulation/Gait General Gait Details: non-amb currently    ADL: ADL Overall ADL's : Needs assistance/impaired Eating/Feeding: Supervision/ safety, Set up, Sitting Grooming: Supervision/safety, Set up, Sitting Upper Body Bathing: Minimal assistance, Bed level,  Cueing for compensatory techniques, Cueing for safety Lower Body Bathing: Maximal assistance, Bed level(limited by pain) Upper Body Dressing : Minimal assistance, Bed level Lower Body Dressing: Total assistance, Bed level Lower Body Dressing Details (indicate cue type and reason): limited by pain, NWB BLEs  Toilet Transfer: Anterior/posterior, Minimal assistance, +2 for safety/equipment, BSC(simulated from recliner ) Toilet Transfer Details (indicate cue type and reason): requires +2 due to increased pain for safety to ensure NWB to B LES (daughter assisting) Clinical cytogeneticist Details (indicate cue type and reason): NA at this time Functional mobility during ADLs: Minimal assistance, +2 for safety/equipment(+2 for pain for recliner to wc transfers) General ADL Comments: signficantly limited by pain this session    Cognition: Cognition Overall Cognitive Status: Within Functional Limits for tasks assessed Orientation Level: Oriented X4 Cognition Arousal/Alertness: Awake/alert Behavior During Therapy: WFL for tasks assessed/performed Overall Cognitive Status: Within Functional Limits for tasks assessed  Blood pressure (!) 88/58, pulse 72, temperature 98.5 F (36.9 C), temperature source Oral, resp. rate 17, height 5\' 5"  (1.651 m), weight 77.1 kg (170 lb), SpO2 90 %. Physical Exam  Nursing note and vitals reviewed. Constitutional: She is oriented to person, place, and time. She appears well-developed and well-nourished. No distress.  HENT:  Head: Normocephalic and atraumatic.  Eyes: Pupils are equal, round, and reactive to light. Conjunctivae and EOM are normal.  Neck: Normal range of motion.  Cardiovascular: Normal rate and regular rhythm.  Respiratory: Effort normal and breath sounds normal. No respiratory distress. She has no wheezes.  GI: Soft. Bowel sounds are normal. She exhibits no distension. There is no tenderness.  Musculoskeletal:  BLE with compressive dressing--limited due to pain. NV intact.   Neurological: She is alert and oriented to person, place, and time.  Skin: She is not diaphoretic.  Psychiatric: She has a normal mood and affect. Her behavior is normal.  Complains of back pain with lower extremity range of motion.  Also has back pain with attempting to sit up. The right lateral epicondyle is tender to palpation.  She has pain with resisted wrist extension. Motor strength is 4/5 in the right deltoid bicep tricep grip limited by pain 5/5 in left deltoid bicep tricep grip Trace bilateral hip flexors limited by pain.  Able to wiggle toes. Sensation intact light touch bilateral upper and lower limbs  Results for orders placed or performed during the hospital encounter of 03/26/18 (from the past 24 hour(s))  CBC     Status: Abnormal   Collection Time: 03/27/18  8:29 PM  Result  Value Ref Range   WBC 8.4 4.0 - 10.5 K/uL   RBC 3.93 3.87 - 5.11 MIL/uL   Hemoglobin 11.6 (L) 12.0 - 15.0 g/dL   HCT 36.7 36.0 - 46.0 %   MCV 93.4 78.0 - 100.0 fL   MCH 29.5 26.0 - 34.0 pg   MCHC 31.6 30.0 - 36.0 g/dL   RDW 11.6 11.5 - 15.5 %   Platelets 241 150 - 400 K/uL  Creatinine, serum     Status: None   Collection Time: 03/27/18  8:29 PM  Result Value Ref Range   Creatinine, Ser 0.69 0.44 - 1.00 mg/dL   GFR calc non Af Amer >60 >60 mL/min   GFR calc Af Amer >60 >60 mL/min   Dg Ankle Complete Right  Result Date: 03/27/2018 CLINICAL DATA:  Right ankle ORIF EXAM: RIGHT ANKLE - COMPLETE 3+ VIEW; DG C-ARM 61-120 MIN COMPARISON:  None. FLUOROSCOPY TIME:  34 seconds FINDINGS: Lateral compression  plate and screw fixation of a distal fibular fracture. Two medial malleolar screws. Fracture fragments are in near anatomic alignment and position. Ankle mortise is preserved. IMPRESSION: Status post ORIF of the distal fibula and medial malleolus, as above. Electronically Signed   By: Julian Hy M.D.   On: 03/27/2018 18:24   Ct Ankle Left Wo Contrast  Result Date: 03/27/2018 CLINICAL DATA:  Left calcaneal fracture after fall from deck. EXAM: CT OF THE LEFT ANKLE WITHOUT CONTRAST TECHNIQUE: Multidetector CT imaging of the left ankle was performed according to the standard protocol. Multiplanar CT image reconstructions were also generated. COMPARISON:  None. FINDINGS: Bones/Joint/Cartilage Calcaneus: 1. A comminuted fracture of the calcaneus is noted. A fracture undermining the sustentaculum talus is identified without displacement but with intra-articular extension into the anterior subtalar facet. 2. A Sanders type 2A intra-articular fracture line extending through the lateral portion of the posterior articular facet of the subtalar joint is noted, series 7/50 with approximately 2 mm of depression of the lateral fracture fragment. 3. Boehler angle is decreased to 11 degrees, normal 20-40  degrees. 4. The fracture undermining the sustentaculum talus extends into the sinus tarsi. Distal tibia and fibula: Intact without malleolar fracture. Ankle mortise: Congruent without widening of the medial clear space. Talar dome and talus: Intact Navicular, cuboid and cuneiform: Intact without fracture. Bone island noted of the cuboid posterolaterally. Included metatarsal bases: Negative for fracture. Lisfranc articulation: Congruent. Ligaments Suboptimally assessed by CT. Muscles and Tendons Intact extensor tendons. Intact peroneal brevis longus tendons. No entrapment of the posteromedial tendons. Intact Achilles. No abnormality of the plantar fascia. Soft tissues Periarticular soft tissue swelling without focal fluid collection. IMPRESSION: 1. Comminuted intra-articular fracture of the calcaneus with a Sanders type 2A fracture extending through the lateral portion of the posterior articular facet of the subtalar joint and demonstrating approximately 2 mm of depression involving lateral fracture fragment. 2. Nondisplaced fracture involving the sustentacular talus extending into the anterior subtalar joint and sinus tarsi. 3. Decrease in Boehler angle to 11 degrees. 4. No tendon entrapment identified. Electronically Signed   By: Ashley Royalty M.D.   On: 03/27/2018 03:19   Dg Chest Port 1 View  Result Date: 03/26/2018 CLINICAL DATA:  Preop clearance for ankle surgery. EXAM: PORTABLE CHEST 1 VIEW COMPARISON:  01/19/2011 FINDINGS: The heart size and mediastinal contours are within normal limits. Platelike atelectasis is noted at the left lung base. No pulmonary consolidation. No effusion or pneumothorax. The visualized skeletal structures are unremarkable. IMPRESSION: No active disease. Electronically Signed   By: Ashley Royalty M.D.   On: 03/26/2018 20:14   Dg C-arm 1-60 Min  Result Date: 03/27/2018 CLINICAL DATA:  Right ankle ORIF EXAM: RIGHT ANKLE - COMPLETE 3+ VIEW; DG C-ARM 61-120 MIN COMPARISON:  None.  FLUOROSCOPY TIME:  34 seconds FINDINGS: Lateral compression plate and screw fixation of a distal fibular fracture. Two medial malleolar screws. Fracture fragments are in near anatomic alignment and position. Ankle mortise is preserved. IMPRESSION: Status post ORIF of the distal fibula and medial malleolus, as above. Electronically Signed   By: Julian Hy M.D.   On: 03/27/2018 18:24     Assessment/Plan: Diagnosis: Left calcaneus fracture status post closed reduction with right trimalleolar fracture status post ORIF nonweightbearing bilateral lower extremities after a fall 1. Does the need for close, 24 hr/day medical supervision in concert with the patient's rehab needs make it unreasonable for this patient to be served in a less intensive setting? Potentially 2. Co-Morbidities requiring supervision/potential  complications: History of COPD not active with lumbar postlaminectomy syndrome 3. Due to bladder management, bowel management, safety, skin/wound care, disease management, medication administration, pain management and patient education, does the patient require 24 hr/day rehab nursing? Potentially 4. Does the patient require coordinated care of a physician, rehab nurse, PT, OT to address physical and functional deficits in the context of the above medical diagnosis(es)? No Addressing deficits in the following areas: transferring, bathing, dressing and toileting 5. Can the patient actively participate in an intensive therapy program of at least 3 hrs of therapy per day at least 5 days per week? No 6. The potential for patient to make measurable gains while on inpatient rehab is poor 7. Anticipated functional outcomes upon discharge from inpatient rehab are n/a  with PT, n/a with OT, n/a with SLP. 8. Estimated rehab length of stay to reach the above functional goals is: NA 9. Anticipated D/C setting: Home with [facility] lift versus SNF 10. Anticipated post D/C treatments: [place]  therapy 11. Overall Rehab/Functional Prognosis: good  RECOMMENDATIONS: This patient's condition is appropriate for continued rehabilitative care in the following setting: SNF versus home with [facility] lift and 24/7 supervision until allowed to have weightbearing in 6 weeks Patient has agreed to participate in recommended program. Potentially Note that insurance prior authorization may be required for reimbursement for recommended care.  Comment: Do not think patient can achieve independence with sliding board transfers.  Does not need CIR for daughter to learn Harrel Lemon lift "I have personally performed a face to face diagnostic evaluation of this patient.  Additionally, I have reviewed and concur with the physician assistant's documentation above." Charlett Blake M.D. [facility] FAAPM&R (Sports Med, Neuromuscular Med) Diplomate [place] Board of [place], PA-C 03/28/2018

## 2018-03-28 NOTE — Progress Notes (Signed)
Subjective: 1 Day Post-Op Procedure(s) (LRB): OPEN REDUCTION INTERNAL FIXATION (ORIF) ANKLE FRACTURE (Right) Patient reports pain as moderate.  She normally takes oxycodone 15 mg 4 times daily for chronic back pain.  She has significant pain in both of her ankles/heels.  She has got out of bed to the chair with physical therapy nonweightbearing on both lower extremities.  She denies chest pain or shortness of breath.  She is taking by mouth okay.  Foley catheter was removed this morning.  Objective: Vital signs in last 24 hours: Temp:  [96.8 F (36 C)-98.5 F (36.9 C)] 98.5 F (36.9 C) (07/18 0342) Pulse Rate:  [70-98] 72 (07/18 0342) Resp:  [9-17] 17 (07/18 0342) BP: (76-104)/(43-71) 88/58 (07/18 0342) SpO2:  [90 %-98 %] 90 % (07/18 0342) Weight:  [77.1 kg (170 lb)] 77.1 kg (170 lb) (07/17 1352)  Intake/Output from previous day: 07/17 0701 - 07/18 0700 In: 1810.4 [I.V.:1734.1; IV Piggyback:76.2] Out: 2195 [Urine:2175; Blood:20] Intake/Output this shift: Total I/O In: 240 [P.O.:240] Out: 2250 [Urine:2250]  Recent Labs    03/26/18 1812 03/27/18 2029  HGB 12.1 11.6*   Recent Labs    03/26/18 1812 03/27/18 2029  WBC 7.6 8.4  RBC 4.13 3.93  HCT 38.4 36.7  PLT 211 241   Recent Labs    03/26/18 1812 03/27/18 2029  NA 138  --   K 3.8  --   CL 104  --   CO2 25  --   BUN 7  --   CREATININE 0.68 0.69  GLUCOSE 115*  --   CALCIUM 8.8*  --    Recent Labs    03/26/18 1812  INR 0.91  Bilateral lower extremity exams: Bilateral posterior splints are intact.  She moves toes actively.  Some swelling of the toes of the right foot.  Good sensation in the toes.  Good capillary refill.   Assessment/Plan: 1 Day Post-Op Procedure(s) (LRB): OPEN REDUCTION INTERNAL FIXATION (ORIF) ANKLE FRACTURE (Right) Left calcaneus fracture. Plan: Dr. Altamese Ivanhoe reviewed the CT scan of the left ankle/calcaneus and did not feel that she is a surgical candidate based upon the CT and  x-rays.  We appreciate his willingness to do this as he is currently out of town. At this point she will continue nonweightbearing on the bilateral lower extremities.  Her family has already spoken to a home health agency about home care.  The patient's daughter wishes to take the patient home and she will probably need a wheelchair and any other recommendations from physical therapy/Occupational Therapy. Continue on Lovenox subcu while in the hospital for DVT prophylaxis.  When discharge we will start her on Eliquis.  I will increase her pain meds closer to her normal baseline of oxycodone. We will plan on discharge home with home health tomorrow.    Erlene Senters 03/28/2018, 1:34 PM

## 2018-03-28 NOTE — Evaluation (Signed)
Physical Therapy Evaluation Patient Details Name: Melissa Conley MRN: [id number] DOB: 07-Jul-1961 Today's Date: 03/28/2018   History of Present Illness  57 yo female who fell several days ago and was seen at [facility] and sent home to f/u as OP; She is s/p Open reduction internal fixation of right trimalleolar ankle fracture, Closed treatment of comminuted intra-articular calcaneus fracture, left--> possible surgery pending consult by Dr. Marcelino Scot who is on vacation (per pt) PMH: COPD, chronic pain, spondylosis, anxiety, (partial tendon tear of?triceps--per pt report--chart hx does not support this)  Clinical Impression  Pt admitted with above diagnosis. Pt currently with functional limitations due to the deficits listed below (see PT Problem List). Pt able to perform A-P transfer with min assist today, she requires multiple rests during transfer d/t pain and fatigue; will continue to follow, has supportive daughter, may need CIR or other therapies  f/u depending on progress;will continue to assess for needs;  Pt will benefit from skilled PT to increase their independence and safety with mobility to allow discharge to the venue listed below.       Follow Up Recommendations CIR    Equipment Recommendations  Other (comment)(TBD)    Recommendations for Other Services       Precautions / Restrictions Precautions Precautions: Fall Restrictions RLE Weight Bearing: Non weight bearing LLE Weight Bearing: Non weight bearing      Mobility  Bed Mobility Overal bed mobility: Needs Assistance Bed Mobility: Supine to Sit     Supine to sit: Min assist     General bed mobility comments: assist with LLE, incr time  Transfers Overall transfer level: Needs assistance Equipment used: None Transfers: Comptroller transfers: Min assist   General transfer comment: light assist with LEs, 3 rest breaks required d/t pain and  fatigue  Ambulation/Gait             General Gait Details: non-amb currently  Stairs            Wheelchair Mobility    Modified Rankin (Stroke Patients Only)       Balance Overall balance assessment: Needs assistance;History of Falls   Sitting balance-Leahy Scale: Good         Standing balance comment: n/a                             Pertinent Vitals/Pain Pain Assessment: 0-10 Pain Score: 7  Pain Location: LLE, R LE block still working Pain Descriptors / Indicators: Guarding;Grimacing Pain Intervention(s): Limited activity within patient's tolerance;Monitored during session;Premedicated before session;Repositioned    Home Living Family/patient expects to be discharged to:: Private residence Living Arrangements: Alone   Type of Home: Mobile home Home Access: Stairs to enter   Technical brewer of Steps: 4 Home Layout: One level Home Equipment: None      Prior Function Level of Independence: Independent               Hand Dominance        Extremity/Trunk Assessment   Upper Extremity Assessment Upper Extremity Assessment: Defer to OT evaluation    Lower Extremity Assessment Lower Extremity Assessment: RLE deficits/detail;LLE deficits/detail RLE Deficits / Details: knee and hip AAROM grossly to 90*/90*, muscle guarding limiting full ROM RLE: Unable to fully assess due to immobilization LLE Deficits / Details: knee and hip AAROM grossly to 90*/90*, muscle guarding limiting full ROM LLE: Unable to fully assess due to  pain;Unable to fully assess due to immobilization       Communication   Communication: No difficulties  Cognition Arousal/Alertness: Awake/alert Behavior During Therapy: WFL for tasks assessed/performed Overall Cognitive Status: Within Functional Limits for tasks assessed                                        General Comments      Exercises     Assessment/Plan    PT Assessment  Patient needs continued PT services  PT Problem List Decreased mobility;Decreased activity tolerance;Pain;Decreased knowledge of precautions;Decreased knowledge of use of DME       PT Treatment Interventions DME instruction;Therapeutic activities;Functional mobility training;Therapeutic exercise;Patient/family education    PT Goals (Current goals can be found in the Care Plan section)  Acute Rehab PT Goals Patient Stated Goal: back to being independent PT Goal Formulation: With patient/family Time For Goal Achievement: 04/11/18 Potential to Achieve Goals: Good    Frequency Min 4X/week   Barriers to discharge        Co-evaluation               AM-PAC PT "6 Clicks" Daily Activity  Outcome Measure Difficulty turning over in bed (including adjusting bedclothes, sheets and blankets)?: A Lot Difficulty moving from lying on back to sitting on the side of the bed? : Unable Difficulty sitting down on and standing up from a chair with arms (e.g., wheelchair, bedside commode, etc,.)?: Unable Help needed moving to and from a bed to chair (including a wheelchair)?: Total Help needed walking in hospital room?: Total Help needed climbing 3-5 steps with a railing? : Total 6 Click Score: 7    End of Session   Activity Tolerance: Patient tolerated treatment well;Patient limited by fatigue Patient left: in chair;with call bell/phone within reach;with family/visitor present   PT Visit Diagnosis: History of falling (Z91.81);Other abnormalities of gait and mobility (R26.89)    Time: 9163-8466 PT Time Calculation (min) (ACUTE ONLY): 31 min   Charges:   PT Evaluation $PT Eval Low Complexity: 1 Low PT Treatments $Therapeutic Activity: 8-22 mins   PT G CodesKenyon Ana, PT Pager: [phone] 03/28/2018   Kenyon Ana 03/28/2018, 1:05 PM

## 2018-03-28 NOTE — Op Note (Signed)
NAME: Melissa Conley, Melissa Conley MEDICAL RECORD [id number] ACCOUNT [id number] DATE OF BIRTH:1960/12/15 FACILITY: [facility] LOCATION: [facility] PHYSICIAN:Lucilia Yanni Maudie Mercury, MD  OPERATIVE REPORT  DATE OF PROCEDURE:  03/27/2018  PREOPERATIVE DIAGNOSES:   1.  Trimalleolar ankle fracture, right.  2.  Comminuted intraarticular calcaneus fracture, left.  POSTOPERATIVE DIAGNOSES:   1.  Trimalleolar ankle fracture, right.  2.  Comminuted intraarticular calcaneus fracture, left.  PROCEDURES: 1.  Open reduction internal fixation of right trimalleolar ankle fracture with an anatomic fibular plate on the lateral side and 2 cannulated screws on the medial side.   2.  Interpretation of multiple intraoperative fluoroscopic images. 3.  Closed treatment of comminuted intra-articular calcaneus fracture, left side.  SURGEON:  Dorna Leitz, MD  ASSISTANT:   Gaspar Skeeters, M.D.  ANESTHESIA:  General.    BRIEF HISTORY:  The patient is a 57 year old female who fell several days ago.  She was seen in an outside emergency room and evaluated.  The followup was set for in her hometown, but she, for whatever reason, decided to come to our office via ambulance  with her bilateral lower extremity injuries and inability to stand or walk.  She was unable to take care of herself at home and unfortunately because of this, we felt that she needed treatment.  She was admitted to the hospital for treatment of these  injuries and she was brought to the operating room today for open reduction and fixation of her right ankle fracture.  DESCRIPTION OF PROCEDURE:  The patient was taken to the operating room after adequate anesthesia was obtained with general anesthesia.  The patient was placed supine on the operating table.  The left splint was then taken down and removed.  The skin  actually did not look too bad.  I put a compressive dressing on her with a Webril and a Kerlix and we got a very nice compression across her ankle fracture.  Once  this was done, I put her into a posterior splint and I held that while it was held into  place and attention was then turned to the right leg.  The right leg was prepped and draped in usual sterile fashion.  Following this, the leg was exsanguinated.  Blood pressure tourniquet inflated to 250 mmHg. An incision was made over the medial side  down through subcutaneous tissues down the center for the fracture fragment.  The fracture was identified, cleansed with the healing elements.  The periosteum was pulled out of the wound out of the injured area and the anatomic reduction was achieved.   Two cannulated screws were placed across the wound.  Attention was then turned laterally where the incision was made and dissection down to the fibula.  At this point, there was dramatic amounts of comminution across the midshaft of the fibula fracture,  both anteriorly and posteriorly.  We got an anatomic fibular plate as I certainly was going to be able to span this with anything less than that.  Got 4 screws in distally and then did a manipulative closed reduction.  Really did not manipulate closed  reduction initially and put a guidewire in just to hold the plate in place.  We put the 4 screws and then did a further reduction and once we got an anatomic in terms of length, we then put 3 screws proximally,  2 of those were locking.  We then tried to  look in all the windows of other screws, but really was all in a comminuted area.  At this point, we took images, which showed that we had excellent reduction and fixation.  Posterior lip appeared not to need internal fixation.  So at this point, the  wounds were irrigated and suctioned dry, closed in layers.    Sterile compressive dressing was applied and the patient was taken to recovery and was noted to be in satisfactory condition  AN/NUANCE  D:03/27/2018 T:03/28/2018 JOB:[id number]

## 2018-03-29 MED ORDER — DIPHENHYDRAMINE HCL 25 MG PO CAPS
25.0000 mg | ORAL_CAPSULE | Freq: Four times a day (QID) | ORAL | Status: DC | PRN
  Administered 2018-03-29 – 2018-03-30 (×2): 25 mg via ORAL
  Filled 2018-03-29: qty 1

## 2018-03-29 MED ORDER — HYDROMORPHONE HCL 2 MG PO TABS: 2.0000 mg | ORAL_TABLET | Freq: Four times a day (QID) | ORAL | 0 refills | Status: DC | PRN

## 2018-03-29 MED ORDER — PROMETHAZINE HCL 25 MG PO TABS: 25.0000 mg | ORAL_TABLET | Freq: Four times a day (QID) | ORAL | 0 refills | Status: DC | PRN

## 2018-03-29 MED ORDER — TIZANIDINE HCL 4 MG PO TABS: 4.0000 mg | ORAL_TABLET | Freq: Three times a day (TID) | ORAL | 0 refills | Status: DC | PRN

## 2018-03-29 MED ORDER — APIXABAN 2.5 MG PO TABS: 2.5000 mg | ORAL_TABLET | Freq: Two times a day (BID) | ORAL | 0 refills | Status: DC

## 2018-03-29 NOTE — Care Management Important Message (Signed)
Important Message  Patient Details  Name: Melissa Conley MRN: [id number] Date of Birth: April 09, 1961   Medicare Important Message Given:  Yes    Orbie Pyo 03/29/2018, 3:37 PM

## 2018-03-29 NOTE — Discharge Summary (Addendum)
Patient ID: Melissa Conley MRN: [id number] DOB/AGE: 1961/05/04 57 y.o.  Admit date: 03/26/2018 Discharge date: 03/30/2018  Admission Diagnoses:  Active Problems:   Trimalleolar fracture of right ankle Closed fracture of left calcaneus.  Discharge Diagnoses:  Same  Past Medical History:  Diagnosis Date  . Acid reflux   . Angina at rest [facility])   . Anxiety    hx (03/26/2018)  . Bulging lumbar disc 03/28/2016   Overview:  L4-5, left  . Chronic bronchitis ([place])   . Chronic lower back pain   . Chronic pain syndrome 11/02/2015  . Common migraine    "monthly" (03/26/2018)  . COPD (chronic obstructive pulmonary disease) ([place]) 01/28/2018  . Dysuria 01/31/2017  . Fall from high place 03/25/2018   "deck; fell ~ 91ft; broke both feet"  . Frequent headaches    "qod" (03/26/2018)  . GERD without esophagitis 01/28/2018  . History of abnormal mammogram 12/20/2016  . IBS (irritable bowel syndrome)   . Lumbar radiculopathy 11/02/2015  . Menopausal disorder 01/05/2016  . Neck pain 11/02/2015  . Numbness and tingling of both legs below knees 02/10/2016  . Ovarian cancer ([place])   . Pneumonia    "twice" (03/26/2018)  . Postmenopausal HRT (hormone replacement therapy) 01/31/2017  . 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 Thoracic back pain 11/02/2015  . Unstable angina ([place]) 01/28/2018    Surgeries: Procedure(s): OPEN REDUCTION INTERNAL FIXATION (ORIF) ANKLE FRACTURE on 03/27/2018   Discharged Condition: Improved  Hospital Course: Melissa Conley is an 57 y.o. female who was admitted 03/26/2018 for operative treatment of<principal problem not specified>. Patient has severe unremitting pain that affects sleep, daily activities, and work/hobbies. After pre-op clearance the patient was taken to the operating room on 03/27/2018 and underwent  Procedure(s): OPEN REDUCTION INTERNAL FIXATION (ORIF) ANKLE FRACTURE.  Right. Placement of short leg posterior splint left.  Patient was  given perioperative antibiotics:  Anti-infectives (From admission, onward)   Start     Dose/Rate Route Frequency Ordered Stop   03/27/18 2000  clindamycin (CLEOCIN) IVPB 600 mg     600 mg 100 mL/hr over 30 Minutes Intravenous Every 6 hours 03/27/18 1847 03/28/18 1040   03/27/18 1330  vancomycin (VANCOCIN) IVPB 1000 mg/200 mL premix     1,000 mg 200 mL/hr over 60 Minutes Intravenous On call to O.R. 03/27/18 1326 03/27/18 1431       Patient was given sequential compression devices, early ambulation, and chemoprophylaxis to prevent DVT.  Patient was seen by physical therapy and Occupational Therapy.  She was nonweightbearing on the bilateral lower extremities.  Lovenox subcu was used for DVT prophylaxis in the hospital.  She did have some difficulty controlling her pain but this was adjusted/managed.  On the date of discharge she was taking by mouth and voiding okay.  Her daughter and herself felt that she was doing okay and ready to go home mostly being in a wheelchair.  Patient benefited maximally from hospital stay and there were no complications.    Recent vital signs:  Patient Vitals for the past 24 hrs:  BP Temp Temp src Pulse Resp SpO2  03/30/18 0347 (!) 73/53 98.9 F (37.2 C) - 78 - 93 %  03/29/18 2227 (!) 95/57 - - 72 - -  03/29/18 2225 (!) 83/48 98.9 F (37.2 C) Oral 77 - 97 %  03/29/18 1220 (!) 88/60 99.1 F (37.3 C) Oral 68 18 98 %     Recent laboratory studies:  Recent  Labs    03/27/18 2029  WBC 8.4  HGB 11.6*  HCT 36.7  PLT 241  CREATININE 0.69     Discharge Medications:   Allergies as of 03/30/2018      Reactions   Fluzone [flu Virus Vaccine] Hives   Penicillins Anaphylaxis, Hives   Has patient had a PCN reaction causing immediate rash, facial/tongue/throat swelling, SOB or lightheadedness with hypotension: Yes Has patient had a PCN reaction causing severe rash involving mucus membranes or skin necrosis: No Has patient had a PCN reaction that required  hospitalization: Yes Has patient had a PCN reaction occurring within the last 10 years: Yes If all of the above answers are "NO", then may proceed with Cephalosporin use.   Gabapentin Other (See Comments)   GI Upset   Tapentadol Nausea Only   Methocarbamol Nausea Only   Morphine Nausea And Vomiting   Pt reports that she is not allergic to this medication   Tizanidine Nausea Only      Medication List    STOP taking these medications   aspirin EC 81 MG tablet     TAKE these medications   apixaban 2.5 MG Tabs tablet Commonly known as:  ELIQUIS Take 1 tablet (2.5 mg total) by mouth 2 (two) times daily.   cyanocobalamin 1000 MCG/ML injection Commonly known as:  (VITAMIN B-12) Inject 1 mL into the muscle every 30 (thirty) days.   EPINEPHrine 0.3 mg/0.3 mL Soaj injection Commonly known as:  EPI-PEN Inject 0.3 mg into the muscle as directed.   estrogens (conjugated) 0.625 MG tablet Commonly known as:  PREMARIN Take 0.625 mg by mouth daily.   fluticasone 50 MCG/ACT nasal spray Commonly known as:  FLONASE Place 1 spray into both nostrils daily as needed for allergies or rhinitis.   HYDROmorphone 2 MG tablet Commonly known as:  DILAUDID Take 1-2 tablets (2-4 mg total) by mouth every 6 (six) hours as needed for severe pain.   loratadine 10 MG tablet Commonly known as:  CLARITIN Take 10 mg by mouth daily as needed for allergies.   metoprolol tartrate 50 MG tablet Commonly known as:  LOPRESSOR Take 1 tablet (50 mg total) by mouth once for 1 dose. Take 1 tablet 1 hour prior to cardiac CTA.   multivitamin tablet Take 1 tablet by mouth daily.   nitroGLYCERIN 0.4 MG SL tablet Commonly known as:  NITROSTAT Place 0.4 mg every 5 (five) minutes as needed under the tongue for chest pain.   oxyCODONE 15 MG immediate release tablet Commonly known as:  ROXICODONE Take 15 mg every 4 (four) hours as needed by mouth for pain.   PROAIR HFA 108 (90 Base) MCG/ACT inhaler Generic drug:   albuterol Inhale 1 puff into the lungs 4 (four) times daily as needed for shortness of breath.   promethazine 25 MG tablet Commonly known as:  PHENERGAN Take 1 tablet (25 mg total) by mouth every 6 (six) hours as needed for nausea or vomiting.   tiZANidine 4 MG tablet Commonly known as:  ZANAFLEX Take 1 tablet (4 mg total) by mouth every 8 (eight) hours as needed for muscle spasms.            Durable Medical Equipment  (From admission, onward)        Start     Ordered   03/29/18 1604  For home use only DME Other see comment  Once    Comments:  Drop arm commode   03/29/18 1604   03/29/18 1424  For home use only DME Other see comment  Once    Comments:  Hoyer lift, 30 inch slide board   03/29/18 1424   03/29/18 1419  For home use only DME Hospital bed  Once    Question Answer Comment  Patient has (list medical condition): bilateral ankle fractures   The above medical condition requires: Patient requires the ability to reposition immediately   Head must be elevated greater than: 30 degrees   Bed type Semi-electric      03/29/18 1419   03/28/18 1503  For home use only DME standard manual wheelchair with seat cushion  Once    Comments:  Patient suffers fromRight malleolar ORIF, Left comminuted intraarticular cannulated fracture which impairs their ability to perform daily activities like ambulating in the home.  A cane will not resolve  issue with performing activities of daily living. A wheelchair will allow patient to safely perform daily activities. Patient can safely propel the wheelchair in the home or has a caregiver who can provide assistance.  Accessories: elevating leg rests (ELRs), wheel locks, extensions and anti-tippers.   03/28/18 1507       Discharge Care Instructions  (From admission, onward)        Start     Ordered   03/30/18 0000  Non weight bearing    Question Answer Comment  Laterality bilateral   Extremity Lower      03/30/18 0900   03/29/18  0000  Non weight bearing    Question:  Laterality  Answer:  bilateral   03/29/18 1326      Diagnostic Studies: Dg Ankle Complete Right  Result Date: 03/27/2018 CLINICAL DATA:  Right ankle ORIF EXAM: RIGHT ANKLE - COMPLETE 3+ VIEW; DG C-ARM 61-120 MIN COMPARISON:  None. FLUOROSCOPY TIME:  34 seconds FINDINGS: Lateral compression plate and screw fixation of a distal fibular fracture. Two medial malleolar screws. Fracture fragments are in near anatomic alignment and position. Ankle mortise is preserved. IMPRESSION: Status post ORIF of the distal fibula and medial malleolus, as above. Electronically Signed   By: Julian Hy M.D.   On: 03/27/2018 18:24   Ct Ankle Left Wo Contrast  Result Date: 03/27/2018 CLINICAL DATA:  Left calcaneal fracture after fall from deck. EXAM: CT OF THE LEFT ANKLE WITHOUT CONTRAST TECHNIQUE: Multidetector CT imaging of the left ankle was performed according to the standard protocol. Multiplanar CT image reconstructions were also generated. COMPARISON:  None. FINDINGS: Bones/Joint/Cartilage Calcaneus: 1. A comminuted fracture of the calcaneus is noted. A fracture undermining the sustentaculum talus is identified without displacement but with intra-articular extension into the anterior subtalar facet. 2. A Sanders type 2A intra-articular fracture line extending through the lateral portion of the posterior articular facet of the subtalar joint is noted, series 7/50 with approximately 2 mm of depression of the lateral fracture fragment. 3. Boehler angle is decreased to 11 degrees, normal 20-40 degrees. 4. The fracture undermining the sustentaculum talus extends into the sinus tarsi. Distal tibia and fibula: Intact without malleolar fracture. Ankle mortise: Congruent without widening of the medial clear space. Talar dome and talus: Intact Navicular, cuboid and cuneiform: Intact without fracture. Bone island noted of the cuboid posterolaterally. Included metatarsal bases: Negative  for fracture. Lisfranc articulation: Congruent. Ligaments Suboptimally assessed by CT. Muscles and Tendons Intact extensor tendons. Intact peroneal brevis longus tendons. No entrapment of the posteromedial tendons. Intact Achilles. No abnormality of the plantar fascia. Soft tissues Periarticular soft tissue swelling without focal fluid collection. IMPRESSION: 1. Comminuted  intra-articular fracture of the calcaneus with a Sanders type 2A fracture extending through the lateral portion of the posterior articular facet of the subtalar joint and demonstrating approximately 2 mm of depression involving lateral fracture fragment. 2. Nondisplaced fracture involving the sustentacular talus extending into the anterior subtalar joint and sinus tarsi. 3. Decrease in Boehler angle to 11 degrees. 4. No tendon entrapment identified. Electronically Signed   By: Ashley Royalty M.D.   On: 03/27/2018 03:19   Dg Chest Port 1 View  Result Date: 03/26/2018 CLINICAL DATA:  Preop clearance for ankle surgery. EXAM: PORTABLE CHEST 1 VIEW COMPARISON:  01/19/2011 FINDINGS: The heart size and mediastinal contours are within normal limits. Platelike atelectasis is noted at the left lung base. No pulmonary consolidation. No effusion or pneumothorax. The visualized skeletal structures are unremarkable. IMPRESSION: No active disease. Electronically Signed   By: Ashley Royalty M.D.   On: 03/26/2018 20:14   Dg C-arm 1-60 Min  Result Date: 03/27/2018 CLINICAL DATA:  Right ankle ORIF EXAM: RIGHT ANKLE - COMPLETE 3+ VIEW; DG C-ARM 61-120 MIN COMPARISON:  None. FLUOROSCOPY TIME:  34 seconds FINDINGS: Lateral compression plate and screw fixation of a distal fibular fracture. Two medial malleolar screws. Fracture fragments are in near anatomic alignment and position. Ankle mortise is preserved. IMPRESSION: Status post ORIF of the distal fibula and medial malleolus, as above. Electronically Signed   By: Julian Hy M.D.   On: 03/27/2018 18:24     Disposition: Discharge disposition: 01-Home or Self Care       Discharge Instructions    Bed to Chair Transfer   Complete by:  As directed    Call MD / Call 911   Complete by:  As directed    If you experience chest pain or shortness of breath, CALL 911 and be transported to the hospital emergency room.  If you develope a fever above 101 F, pus (white drainage) or increased drainage or redness at the wound, or calf pain, call your surgeon's office.   Call MD / Call 911   Complete by:  As directed    If you experience chest pain or shortness of breath, CALL 911 and be transported to the hospital emergency room.  If you develope a fever above 101 F, pus (white drainage) or increased drainage or redness at the wound, or calf pain, call your surgeon's office.   Constipation Prevention   Complete by:  As directed    Drink plenty of fluids.  Prune juice may be helpful.  You may use a stool softener, such as Colace (over the counter) 100 mg twice a day.  Use MiraLax (over the counter) for constipation as needed.   Constipation Prevention   Complete by:  As directed    Drink plenty of fluids.  Prune juice may be helpful.  You may use a stool softener, such as Colace (over the counter) 100 mg twice a day.  Use MiraLax (over the counter) for constipation as needed.   Diet - low sodium heart healthy   Complete by:  As directed    Increase activity slowly as tolerated   Complete by:  As directed    Increase activity slowly as tolerated   Complete by:  As directed    Non weight bearing   Complete by:  As directed    Laterality:  bilateral   Non weight bearing   Complete by:  As directed    Laterality:  bilateral   Extremity:  Lower  Home health physical therapy and home health assistance was ordered.  Follow-up Information    Dorna Leitz, MD. Schedule an appointment as soon as possible for a visit in 10 days.   Specialty:  Orthopedic Surgery Contact information: [place] [address] [phone]            Signed: Erlene Senters 03/30/2018, 9:00 AM

## 2018-03-29 NOTE — Care Management (Signed)
    Durable Medical Equipment  (From admission, onward)        Start     Ordered   03/29/18 1420  For home use only DME Other see comment  Once    Comments:  Harrel Lemon lift   03/29/18 1420   03/29/18 1419  For home use only DME Hospital bed  Once    Question Answer Comment  Patient has (list medical condition): bilateral ankle fractures   The above medical condition requires: Patient requires the ability to reposition immediately   Head must be elevated greater than: 30 degrees   Bed type Semi-electric      03/29/18 1419   03/28/18 1503  For home use only DME standard manual wheelchair with seat cushion  Once    Comments:  Patient suffers fromRight malleolar ORIF, Left comminuted intraarticular cannulated fracture which impairs their ability to perform daily activities like ambulating in the home.  A cane will not resolve  issue with performing activities of daily living. A wheelchair will allow patient to safely perform daily activities. Patient can safely propel the wheelchair in the home or has a caregiver who can provide assistance.  Accessories: elevating leg rests (ELRs), wheel locks, extensions and anti-tippers.   03/28/18 1507

## 2018-03-29 NOTE — Progress Notes (Signed)
Occupational Therapy Treatment Patient Details Name: Melissa Conley MRN: [id number] DOB: 05/09/1961 Today's Date: 03/29/2018    History of present illness 57 yo female who fell several days ago and was seen at [facility] and sent home to f/u as OP; She is s/p open reduction internal fixation of right trimalleolar ankle fracture, closed treatment of comminuted intra-articular calcaneus fracture. PMH signficant for but not limited to: COPD, chronic pain, spondylosis, anxiety, IBS, lumbar fusion.    OT comments  Patient continues to be significantly limited by pain.  Denied by CIR, but educated on safest position (bed level) to complete bathing and dressing tasks.  Educated on importance of exiting bed daily (as requires maximal encouragement today), strength exercises for UEs, transfers, and precautions. Declines simulated toilet transfer using 3:1, educated on DME. Patient will have support of daughter at home, follow up with HHOT to maximize independence with ADLs.  Will continue to follow while admitted.    Follow Up Recommendations  Supervision/Assistance - 24 hour;Home health OT    Equipment Recommendations  3 in 1 bedside commode(drop arm)    Recommendations for Other Services      Precautions / Restrictions Precautions Precautions: Fall Required Braces or Orthoses: Other Brace/Splint Other Brace/Splint: post op splints B LEs Restrictions Weight Bearing Restrictions: Yes RLE Weight Bearing: Non weight bearing LLE Weight Bearing: Non weight bearing       Mobility Bed Mobility Overal bed mobility: Needs Assistance Bed Mobility: Rolling;Supine to Sit(long sitting ) Rolling: Min guard   Supine to sit: Min assist     General bed mobility comments: cueing for safety, technique, limited by pain   Transfers Overall transfer level: Needs assistance Equipment used: None Transfers: Comptroller transfers: Min assist;+2  safety/equipment   General transfer comment: +2 assist for a-p transfer due to increased pain, max encouragement to transfer and requires multiple rest breaks due to fatigeu    Balance Overall balance assessment: Needs assistance;History of Falls   Sitting balance-Leahy Scale: Good         Standing balance comment: n/a                           ADL either performed or assessed with clinical judgement   ADL Overall ADL's : Needs assistance/impaired     Grooming: Set up;Wash/dry face;Sitting Grooming Details (indicate cue type and reason): seated in recliner                  Toilet Transfer: Anterior/posterior;Minimal assistance;+2 for safety/equipment;BSC(simulated to recliner) Toilet Transfer Details (indicate cue type and reason): requires +2 for safety due to pain            General ADL Comments: limited tolerance to ADLs at this time due to pain      Vision       Perception     Praxis      Cognition Arousal/Alertness: Awake/alert Behavior During Therapy: WFL for tasks assessed/performed Overall Cognitive Status: Within Functional Limits for tasks assessed                                          Exercises Exercises: General Upper Extremity General Exercises - Upper Extremity Shoulder Flexion: AROM;Left;Right;10 reps;Seated;Theraband Theraband Level (Shoulder Flexion): Level 3 (Green) Shoulder Extension: AROM;Strengthening;Left;Right;10 reps;Theraband Theraband Level (Shoulder Extension): Level 3 (  Green) Elbow Flexion: Strengthening;AROM;10 reps;Seated;Theraband Theraband Level (Elbow Flexion): Level 3 (Green) Elbow Extension: AROM;Right;Left;10 reps;Seated;Theraband Theraband Level (Elbow Extension): Level 3 (Green) General Exercises - Lower Extremity Quad Sets: AROM;Left(x6 reps refused further due to pain and refused RLE all together.  ) Gluteal Sets: AROM;Both;10 reps;Supine   Shoulder Instructions        General Comments signficantly limited by pain     Pertinent Vitals/ Pain       Pain Assessment: Faces Pain Score: 7  Faces Pain Scale: Hurts worst Pain Location: B lower legs Pain Descriptors / Indicators: Guarding;Grimacing;Discomfort;Moaning;Crying Pain Intervention(s): Limited activity within patient's tolerance;Monitored during session;Repositioned;Ice applied;Patient requesting pain meds-RN notified  Home Living                                          Prior Functioning/Environment              Frequency  Min 2X/week        Progress Toward Goals  OT Goals(current goals can now be found in the care plan section)  Progress towards OT goals: Progressing toward goals  Acute Rehab OT Goals Patient Stated Goal: to get home  OT Goal Formulation: With patient Time For Goal Achievement: 04/11/18 Potential to Achieve Goals: Good  Plan Frequency remains appropriate;Discharge plan needs to be updated    Co-evaluation    PT/OT/SLP Co-Evaluation/Treatment: Yes Reason for Co-Treatment: For patient/therapist safety;Other (comment)(pain and tolerance)   OT goals addressed during session: ADL's and self-care;Strengthening/ROM      AM-PAC PT "6 Clicks" Daily Activity     Outcome Measure   Help from another person eating meals?: None Help from another person taking care of personal grooming?: None Help from another person toileting, which includes using toliet, bedpan, or urinal?: A Lot Help from another person bathing (including washing, rinsing, drying)?: A Lot Help from another person to put on and taking off regular upper body clothing?: None Help from another person to put on and taking off regular lower body clothing?: Total 6 Click Score: 17    End of Session    OT Visit Diagnosis: Other abnormalities of gait and mobility (R26.89);Muscle weakness (generalized) (M62.81);Pain Pain - Right/Left: (both L and R) Pain - part of body: Leg;Ankle and  joints of foot   Activity Tolerance Patient limited by pain   Patient Left in chair;with call bell/phone within reach;with family/visitor present   Nurse Communication Mobility status;Patient requests pain meds;Other (comment)(splint to R LE digging into calf)        Time: 5374-8270 OT Time Calculation (min): 26 min  Charges: OT General Charges $OT Visit: 1 Visit OT Treatments $Self Care/Home Management : 8-22 mins  Delight Stare, OTR/L  Pager [place] 03/29/2018, 1:54 PM

## 2018-03-29 NOTE — Care Management Note (Signed)
Case Management Note  Patient Details  Name: Melissa Conley MRN: [id number] Date of Birth: 10-Dec-1960  Subjective/Objective:  57 yr old female s/p fall, underwent right ankle ORIF and closed treatment of left ankle fracture. Patient is NWB.                   Action/Plan: Case manager spoke with patient concerning discharge plan. She is currently in a lot of pain. They request [facility]. CM called referral to [facility]. Faxed orders, OP note and H&P to her at [phone]. They will not have staff available until Monday, 04/01/18. Case manager pinformed patient and her daughter.   Expected Discharge Date:   pending               Expected Discharge Plan:  [place]  In-House Referral:  NA  Discharge planning Services  CM Consult  Post Acute Care Choice:  Durable Medical Equipment, Home Health Choice offered to:  Patient, Adult Children  DME Arranged:  Wheelchair manual, Other see comment(Hoyer lift) DME Agency:  [place]:  PT, Nurse's Aide [place] Agency:  [place]  Status of Service:  In process, will continue to follow  If discussed at Long Length of Stay Meetings, dates discussed:    Additional Comments:  Ninfa Meeker, RN 03/29/2018, 11:39 AM

## 2018-03-29 NOTE — Progress Notes (Signed)
    Durable Medical Equipment  (From admission, onward)        Start     Ordered   03/29/18 1144  For home use only DME Other see comment  Once    Comments:  Harrel Lemon lift   03/29/18 1143   03/28/18 1503  For home use only DME standard manual wheelchair with seat cushion  Once    Comments:  Patient suffers fromRight malleolar ORIF, Left comminuted intraarticular cannulated fracture which impairs their ability to perform daily activities like ambulating in the home.  A cane will not resolve  issue with performing activities of daily living. A wheelchair will allow patient to safely perform daily activities. Patient can safely propel the wheelchair in the home or has a caregiver who can provide assistance.  Accessories: elevating leg rests (ELRs), wheel locks, extensions and anti-tippers.   03/28/18 [place], PTA pager [phone]

## 2018-03-29 NOTE — Plan of Care (Signed)
  Problem: Education: Goal: Knowledge of General Education information will improve Outcome: Progressing   Problem: Clinical Measurements: Goal: Will remain free from infection Outcome: Progressing Goal: Diagnostic test results will improve Outcome: Progressing Goal: Respiratory complications will improve Outcome: Progressing   Problem: Nutrition: Goal: Adequate nutrition will be maintained Outcome: Progressing   Problem: Elimination: Goal: Will not experience complications related to bowel motility Outcome: Progressing Goal: Will not experience complications related to urinary retention Outcome: Progressing   Problem: Safety: Goal: Ability to remain free from injury will improve Outcome: Progressing   Problem: Skin Integrity: Goal: Risk for impaired skin integrity will decrease Outcome: Progressing

## 2018-03-29 NOTE — Progress Notes (Signed)
Orthopedic Tech Progress Note Patient Details:  Melissa Conley Apr 06, 1961 [id number]  Ortho Devices Type of Ortho Device: Ace wrap, Short leg splint Ortho Device/Splint Interventions: Application   Post Interventions Patient Tolerated: Well Instructions Provided: Care of device   [place] Pink 03/29/2018, 1:46 PM

## 2018-03-29 NOTE — Progress Notes (Signed)
Inpatient Rehabilitation  Please see consult by Dr. Letta Pate on 03/28/18 for full details; recommending SNF versus home with hoyer and 24/7 caregiver assist given weight bearing restrictions.  Notified nurse case Freight forwarder and CSW.  Will sign off.  Call if questions.   Carmelia Roller., CCC/SLP Admission Coordinator  [place]  Cell [phone]

## 2018-03-29 NOTE — Progress Notes (Signed)
Subjective: 2 Days Post-Op Procedure(s) (LRB): OPEN REDUCTION INTERNAL FIXATION (ORIF) ANKLE FRACTURE (Right) Patient reports pain as moderate.  Out of bed to chair.  Taking by mouth and voiding okay.  Complains that the right splint is rubbing her.  Objective: Vital signs in last 24 hours: Temp:  [98.2 F (36.8 C)-98.9 F (37.2 C)] 98.3 F (36.8 C) (07/19 0432) Pulse Rate:  [72-78] 74 (07/19 0432) Resp:  [17] 17 (07/18 1542) BP: (92-99)/(57-72) 92/57 (07/19 0432) SpO2:  [95 %-100 %] 98 % (07/19 0432)  Intake/Output from previous day: 07/18 0701 - 07/19 0700 In: 3212.7 [P.O.:462; I.V.:2750.7] Out: 4450 [Urine:4450] Intake/Output this shift: Total I/O In: -  Out: 500 [Urine:500]  Recent Labs    03/26/18 1812 03/27/18 2029  HGB 12.1 11.6*   Recent Labs    03/26/18 1812 03/27/18 2029  WBC 7.6 8.4  RBC 4.13 3.93  HCT 38.4 36.7  PLT 211 241   Recent Labs    03/26/18 1812 03/27/18 2029  NA 138  --   K 3.8  --   CL 104  --   CO2 25  --   BUN 7  --   CREATININE 0.68 0.69  GLUCOSE 115*  --   CALCIUM 8.8*  --    Recent Labs    03/26/18 1812  INR 0.91  Left lower extremity exam: Posterior splint is intact.  Moves toes actively. Right lower extremity exam: N/V intact to toes.  Moderate swelling.  The splint may be rubbing her a bit.  No evidence of skin breakdown or other issues.   Assessment/Plan: 2 Days Post-Op Procedure(s) (LRB): OPEN REDUCTION INTERNAL FIXATION (ORIF) ANKLE FRACTURE (Right) Closed left calcaneus fracture. Plan: I removed her posterior splint from her right ankle.  I will have the [place] place a new posterior splint on her right lower extremity. We will discharge her home today.  She will be nonweightbearing bilaterally.  She will need home health physical therapy. I will start her on Eliquis 2.5 mg twice daily x3 weeks as she is a high risk for DVT since she has bilateral lower extremity fractures. I will write her for Dilaudid 2 mg  tablets for breakthrough pain along with tizanidine 4 mg.  She will follow-up with Dr. Berenice Primas in 7 to 10 days.  I spoke with the patient and her daughter and they are on board with the plan.    Erlene Senters 03/29/2018, 1:15 PM

## 2018-03-29 NOTE — Progress Notes (Signed)
Physical Therapy Treatment Patient Details Name: Melissa Conley MRN: [id number] DOB: December 19, 1960 Today's Date: 03/29/2018    History of Present Illness 57 yo female who fell several days ago and was seen at [facility] and sent home to f/u as OP; She is s/p open reduction internal fixation of right trimalleolar ankle fracture, closed treatment of comminuted intra-articular calcaneus fracture. PMH signficant for but not limited to: COPD, chronic pain, spondylosis, anxiety, IBS, lumbar fusion.     PT Comments    Pt performed AP transfer OOB with max cues for encouragement.  Pt denied CIR placement and choosing to d/c home.  She will require assistance at home for all mobility and the following equipment listed below.  Pt fatigues quickly during session.  Reviewed use of Incentive Spirometer to improve strength of lungs and reduce risk of respiratory complications.  Pt agreed to perform 10x/hr.  Pt and daughter uncertain with entry into home.  She will likely require ambulance transport as she feel the [facility] will not get through the front door.  Will inform supervising PT of need for change in recommendations based on CIR denial.   Follow Up Recommendations  SNF;Supervision/Assistance - 24 hour ( Pt refusing SNF placement and will require HHPT and aide, with ambulance transport home)     Equipment Recommendations  Other (comment);Wheelchair cushion (measurements PT);Wheelchair (measurements PT)(hoyer lift, long 30 inch slide board, WC with elevating leg rests and drop arm commode.  )    Recommendations for Other Services       Precautions / Restrictions Precautions Precautions: Fall Required Braces or Orthoses: Other Brace/Splint Other Brace/Splint: post op splints B LEs Restrictions Weight Bearing Restrictions: Yes RLE Weight Bearing: Non weight bearing LLE Weight Bearing: Non weight bearing    Mobility  Bed Mobility Overal bed mobility: Needs Assistance Bed Mobility: Rolling;Sit to  Sidelying Rolling: Min guard   Supine to sit: Min assist     General bed mobility comments: cueing for safety, limited by pain   Transfers Overall transfer level: Needs assistance Equipment used: None Transfers: Comptroller transfers: Min assist;+2 safety/equipment   General transfer comment: 2+ assist for safety due to increased pain during transfer, A B LEs and steady assist at trunk (Remains to require multiple scoots with rest breaks due to pain.  )  Ambulation/Gait Ambulation/Gait assistance: (NT)               Stairs             Wheelchair Mobility    Modified Rankin (Stroke Patients Only)       Balance Overall balance assessment: Needs assistance;History of Falls   Sitting balance-Leahy Scale: Good         Standing balance comment: n/a                            Cognition Arousal/Alertness: Awake/alert Behavior During Therapy: WFL for tasks assessed/performed Overall Cognitive Status: Within Functional Limits for tasks assessed                                        Exercises General Exercises - Lower Extremity Quad Sets: AROM;Left(x6 reps refused further due to pain and refused RLE all together.  ) Gluteal Sets: AROM;Both;10 reps;Supine    General Comments  Pertinent Vitals/Pain Pain Assessment: Faces Pain Score: 7  Pain Location: B lower legs Pain Descriptors / Indicators: Guarding;Grimacing;Discomfort;Moaning;Crying(increased pain after transfer from recliner to bed.  ) Pain Intervention(s): Monitored during session;Repositioned(refused Ice reports weight is too painful.  Daughter to place ziplock baggy of ice on her as she reports this is more comfortable.  )    Home Living                      Prior Function            PT Goals (current goals can now be found in the care plan section) Acute Rehab PT Goals Patient Stated Goal: to get home   Progress towards PT goals: Progressing toward goals    Frequency    Min 4X/week      PT Plan Discharge plan needs to be updated    Co-evaluation PT/OT/SLP Co-Evaluation/Treatment: Yes Reason for Co-Treatment: Complexity of the patient's impairments (multi-system involvement);Necessary to address cognition/behavior during functional activity;For patient/therapist safety(too much pain in L ankle to tolerate multiple sessions.  )          AM-PAC PT "6 Clicks" Daily Activity  Outcome Measure  Difficulty turning over in bed (including adjusting bedclothes, sheets and blankets)?: A Lot Difficulty moving from lying on back to sitting on the side of the bed? : A Lot Difficulty sitting down on and standing up from a chair with arms (e.g., wheelchair, bedside commode, etc,.)?: Unable Help needed moving to and from a bed to chair (including a wheelchair)?: A Lot Help needed walking in hospital room?: Total Help needed climbing 3-5 steps with a railing? : Total 6 Click Score: 9    End of Session   Activity Tolerance: Patient tolerated treatment well;Patient limited by fatigue Patient left: in chair;with call bell/phone within reach;with family/visitor present Nurse Communication: Mobility status PT Visit Diagnosis: History of falling (Z91.81);Other abnormalities of gait and mobility (R26.89)     Time: 0355-9741 PT Time Calculation (min) (ACUTE ONLY): 24 min  Charges:  $Therapeutic Activity: 8-22 mins                    G Codes:       [name], PTA pager [phone]    Cristela Blue 03/29/2018, 1:36 PM

## 2018-03-30 NOTE — Progress Notes (Signed)
Patient discharging home today. Discharge instructions explained to patient and she verbalized understanding. C/O pain med x1 with prn pain med. Took all personal belongings. No further questions or concerns voiced. Left Via PTAR.

## 2018-03-30 NOTE — Progress Notes (Signed)
Subjective: 3 Days Post-Op Procedure(s) (LRB): OPEN REDUCTION INTERNAL FIXATION (ORIF) ANKLE FRACTURE (Right) Patient reports pain as moderate.    Objective: Vital signs in last 24 hours: Temp:  [98.9 F (37.2 C)-99.1 F (37.3 C)] 98.9 F (37.2 C) (07/20 0347) Pulse Rate:  [68-78] 78 (07/20 0347) Resp:  [18] 18 (07/19 1220) BP: (73-95)/(48-60) 73/53 (07/20 0347) SpO2:  [93 %-98 %] 93 % (07/20 0347)  Intake/Output from previous day: 07/19 0701 - 07/20 0700 In: -  Out: 5947 [Urine:1650] Intake/Output this shift: No intake/output data recorded.  Recent Labs    03/27/18 2029  HGB 11.6*   Recent Labs    03/27/18 2029  WBC 8.4  RBC 3.93  HCT 36.7  PLT 241   Recent Labs    03/27/18 2029  CREATININE 0.69   No results for input(s): LABPT, INR in the last 72 hours. Bilateral lower extremity exams: Posterior splints intact.  Moves toes actively.   Assessment/Plan: 3 Days Post-Op Procedure(s) (LRB): OPEN REDUCTION INTERNAL FIXATION (ORIF) ANKLE FRACTURE (Right) Closed treatment of left calcaneus fracture.  Nonoperative. Plan: Eliquis 2.5 mg twice daily for DVT prophylaxis x3 weeks. Discharge home. The patient did not get discharged yesterday because they were unable to get a hospital bed for her at home.  This will be delivered today. Follow-up with Dr. Berenice Primas in 10 days.  Nonweightbearing on the bilateral lower extremities.     Melissa Conley 03/30/2018, 8:57 AM

## 2018-03-30 NOTE — Plan of Care (Signed)
  Problem: Education: Goal: Knowledge of General Education information will improve Outcome: Progressing   Problem: Clinical Measurements: Goal: Will remain free from infection Outcome: Progressing   Problem: Activity: Goal: Risk for activity intolerance will decrease Outcome: Progressing   Problem: Pain Managment: Goal: General experience of comfort will improve Outcome: Progressing   Problem: Safety: Goal: Ability to remain free from injury will improve Outcome: Progressing

## 2018-03-30 NOTE — Progress Notes (Signed)
Physical Therapy Treatment Patient Details Name: LORREE MILLAR MRN: [id number] DOB: June 10, 1961 Today's Date: 03/30/2018    History of Present Illness 57 yo female who fell several days ago and was seen at [facility] and sent home to f/u as OP; She is s/p open reduction internal fixation of right trimalleolar ankle fracture, closed treatment of comminuted intra-articular calcaneus fracture. PMH signficant for but not limited to: COPD, chronic pain, spondylosis, anxiety, IBS, lumbar fusion.     PT Comments    Pt in 10/10 pain on arrival to room but agreeable to work with therapy. Today's session focused on transfer training with sliding board. Pt required mod A to transfer to drop arm BSC. She would benefit from continued training with sliding board to maximize functional independence. Pt is expected to d/c home today with HHPT to follow up.    Follow Up Recommendations  SNF;Supervision/Assistance - 24 hour(( Pt refusing SNF placement and will require HHPT and aide, with ambulance transport home))     Equipment Recommendations  Other (comment);Wheelchair cushion (measurements PT);Wheelchair (measurements PT)(hoyer lift, long 30 inch slide board, WC with elevating leg rests and drop arm commode.  )    Recommendations for Other Services       Precautions / Restrictions Precautions Precautions: Fall Required Braces or Orthoses: Other Brace/Splint Other Brace/Splint: post op splints B LEs Restrictions Weight Bearing Restrictions: Yes RLE Weight Bearing: Non weight bearing LLE Weight Bearing: Non weight bearing    Mobility  Bed Mobility Overal bed mobility: Needs Assistance Bed Mobility: Rolling;Supine to Sit(long sitting ) Rolling: Supervision   Supine to sit: Min guard   Sit to sidelying: Min guard General bed mobility comments: Pt with slow guarded movements secondary to pain. Prefers to perform bed mobilities with out assist. Increased time and effort required with use  of bed rails and HOB elevated.  Transfers Overall transfer level: Needs assistance Equipment used: Sliding board Transfers: Lateral/Scoot Transfers          Lateral/Scoot Transfers: Mod assist General transfer comment: mod A for SBT with cues for hand placement and technique. Pt attempted to WB through LEs 1x during transfer. Significant posterior lean during transfer. Will need continued traning for SBT  Ambulation/Gait             General Gait Details: non-amb currently   Stairs             Wheelchair Mobility    Modified Rankin (Stroke Patients Only)       Balance Overall balance assessment: Needs assistance;History of Falls Sitting-balance support: Bilateral upper extremity supported;Feet unsupported Sitting balance-Leahy Scale: Good         Standing balance comment: n/a                            Cognition Arousal/Alertness: Awake/alert Behavior During Therapy: WFL for tasks assessed/performed Overall Cognitive Status: Within Functional Limits for tasks assessed                                        Exercises      General Comments General comments (skin integrity, edema, etc.): educated pt on use of drop arm comode      Pertinent Vitals/Pain Pain Assessment: Faces Faces Pain Scale: Hurts worst Pain Location: B lower legs Pain Descriptors / Indicators: Guarding;Grimacing;Discomfort;Moaning;Crying Pain Intervention(s): Monitored during session;Limited activity within  patient's tolerance;Repositioned    Home Living                      Prior Function            PT Goals (current goals can now be found in the care plan section) Acute Rehab PT Goals Patient Stated Goal: to get home  PT Goal Formulation: With patient/family Time For Goal Achievement: 04/11/18 Potential to Achieve Goals: Good Progress towards PT goals: Progressing toward goals    Frequency    Min 4X/week      PT Plan  Current plan remains appropriate    Co-evaluation              AM-PAC PT "6 Clicks" Daily Activity  Outcome Measure  Difficulty turning over in bed (including adjusting bedclothes, sheets and blankets)?: A Little Difficulty moving from lying on back to sitting on the side of the bed? : Unable Difficulty sitting down on and standing up from a chair with arms (e.g., wheelchair, bedside commode, etc,.)?: Unable Help needed moving to and from a bed to chair (including a wheelchair)?: A Lot Help needed walking in hospital room?: Total Help needed climbing 3-5 steps with a railing? : Total 6 Click Score: 9    End of Session Equipment Utilized During Treatment: Gait belt Activity Tolerance: Patient tolerated treatment well Patient left: with call bell/phone within reach;in bed Nurse Communication: Mobility status PT Visit Diagnosis: History of falling (Z91.81);Other abnormalities of gait and mobility (R26.89)     Time: 8022-3361 PT Time Calculation (min) (ACUTE ONLY): 24 min  Charges:  $Therapeutic Activity: 23-37 mins                    G Codes:       Benjiman Core, [place] Pager [phone] Acute Rehab   Allena Katz 03/30/2018, 10:47 AM

## 2018-03-30 NOTE — Progress Notes (Signed)
Spoke w patient and nurse. Called to have PTAR pick up at 1:00. PTAR packet on chart.  Carles Collet RN [employer] [phone]

## 2018-04-15 ENCOUNTER — Ambulatory Visit (HOSPITAL_COMMUNITY): Payer: Medicare Other | Attending: Cardiology

## 2018-04-15 ENCOUNTER — Ambulatory Visit (HOSPITAL_COMMUNITY): Admission: RE | Admit: 2018-04-15 | Payer: Medicare Other | Source: Ambulatory Visit

## 2018-09-16 NOTE — Progress Notes (Signed)
Cardiology Office Note:    Date:  09/17/2018   ID:  Melissa Conley, DOB 07-13-1961, MRN [id number]  PCP:  Welford Roche, NP  Cardiologist:  Shirlee More, MD    Referring MD: Welford Roche, NP    ASSESSMENT:    1. Angina pectoris ([place])   2. Chest pain in adult   3. Chronic obstructive pulmonary disease, unspecified COPD type ([place])    PLAN:    In order of problems listed above:  1. He is continuing to have chest pain best described as angina pectoris substernal pressure occurs with and without activities recently also has occurred nocturnally relieved at rest 5 to 10 minutes and so far has not needed nitroglycerin.  The episodes are now occurring weekly and she is decided to proceed with cardiac CTA. 2. See above I am unsure if this is ischemic in nature but certainly needs a better diagnostic test than a stress myocardial perfusion study especially with limitations in ambulation with fracture dislocations of both lower extremities 3. Stable managed by her PCP   Next appointment: 3 months after cardiac CTA   Medication Adjustments/Labs and Tests Ordered: Current medicines are reviewed at length with the patient today.  Concerns regarding medicines are outlined above.  Orders Placed This Encounter  Procedures  . CT CORONARY MORPH W/CTA COR W/SCORE W/CA W/CM &/OR WO/CM  . CT CORONARY FRACTIONAL FLOW RESERVE DATA PREP  . CT CORONARY FRACTIONAL FLOW RESERVE FLUID ANALYSIS   No orders of the defined types were placed in this encounter.   No chief complaint on file.   History of Present Illness:    Melissa Conley is a 58 y.o. female with a hx of COPD, chronic pain and narcotic therapy and normal coronary arteriography 08/25/09  And 02/06/18 last seen. Compliance with diet, lifestyle and medications: Yes  Echocardiogram 02/21/2018 showed normal left ventricular function normal diastolic pressure mild mitral regurgitation mild to moderate left atrial enlargement and mild right  ventricular enlargement with normal function.  She had a fall from a ladder with fracture dislocations of both lower extremities requiring complex ORIF and may still require additional surgery in her left ankle for failure to heal fracture.  She continues to have chest pain is occurring more frequently she describes as substernal pressure moderate to severe without exertion no radiation or shortness of breath or diaphoresis is resolved with 5 treatments of rest has not taken nitroglycerin yet and recently episodes have awakened her from her sleep.  After discussion of alternatives for an ischemia evaluation we will proceed with cardiac CTA she is not pregnant and has no dye allergy.  Her COPD is stable not having shortness of breath no palpitations syncope or TIA Past Medical History:  Diagnosis Date  . Acid reflux   . Angina at rest [facility])   . Anxiety    hx (03/26/2018)  . Bulging lumbar disc 03/28/2016   Overview:  L4-5, left  . Chronic bronchitis ([place])   . Chronic lower back pain   . Chronic pain syndrome 11/02/2015  . Common migraine    "monthly" (03/26/2018)  . COPD (chronic obstructive pulmonary disease) ([place]) 01/28/2018  . Dysuria 01/31/2017  . Fall from high place 03/25/2018   "deck; fell ~ 78ft; broke both feet"  . Frequent headaches    "qod" (03/26/2018)  . GERD without esophagitis 01/28/2018  . History of abnormal mammogram 12/20/2016  . IBS (irritable bowel syndrome)   . Lumbar radiculopathy 11/02/2015  . Menopausal disorder 01/05/2016  .  Neck pain 11/02/2015  . Numbness and tingling of both legs below knees 02/10/2016  . Ovarian cancer ([place])   . Pneumonia    "twice" (03/26/2018)  . Postmenopausal HRT (hormone replacement therapy) 01/31/2017  . 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 Thoracic back pain 11/02/2015  . Unstable angina ([place]) 01/28/2018    Past Surgical History:  Procedure Laterality Date  . BACK SURGERY    . CARDIAC CATHETERIZATION      . CHOLECYSTECTOMY OPEN    . DILATION AND CURETTAGE OF UTERUS    . ORIF ANKLE FRACTURE Right 03/27/2018   Procedure: OPEN REDUCTION INTERNAL FIXATION (ORIF) ANKLE FRACTURE;  Surgeon: Dorna Leitz, MD;  Location: [facility];  Service: Orthopedics;  Laterality: Right;  . POSTERIOR LUMBAR FUSION  2000  . TUBAL LIGATION    . VAGINAL HYSTERECTOMY      Current Medications: Current Meds  Medication Sig  . albuterol (PROAIR HFA) 108 (90 Base) MCG/ACT inhaler Inhale 1 puff into the lungs 4 (four) times daily as needed for shortness of breath.   Marland Kitchen apixaban (ELIQUIS) 2.5 MG TABS tablet Take 1 tablet (2.5 mg total) by mouth 2 (two) times daily.  . cyanocobalamin (,VITAMIN B-12,) 1000 MCG/ML injection Inject 1 mL into the muscle every 30 (thirty) days.  Marland Kitchen EPINEPHrine 0.3 mg/0.3 mL IJ SOAJ injection Inject 0.3 mg into the muscle as directed.  . estrogens, conjugated, (PREMARIN) 0.625 MG tablet Take 0.625 mg by mouth daily.  . fluticasone (FLONASE) 50 MCG/ACT nasal spray Place 1 spray into both nostrils daily as needed for allergies or rhinitis.   Marland Kitchen loratadine (CLARITIN) 10 MG tablet Take 10 mg by mouth daily as needed for allergies.   . metoprolol tartrate (LOPRESSOR) 50 MG tablet Take 1 tablet (50 mg total) by mouth once for 1 dose. Take 1 tablet 1 hour prior to cardiac CTA.  . Multiple Vitamin (MULTIVITAMIN) tablet Take 1 tablet by mouth daily.  . nitroGLYCERIN (NITROSTAT) 0.4 MG SL tablet Place 0.4 mg every 5 (five) minutes as needed under the tongue for chest pain.  Marland Kitchen oxyCODONE (ROXICODONE) 15 MG immediate release tablet Take 15 mg every 4 (four) hours as needed by mouth for pain.  . promethazine (PHENERGAN) 25 MG tablet Take 1 tablet (25 mg total) by mouth every 6 (six) hours as needed for nausea or vomiting.  Marland Kitchen tiZANidine (ZANAFLEX) 4 MG tablet Take 1 tablet (4 mg total) by mouth every 8 (eight) hours as needed for muscle spasms.     Allergies:   Fluzone [flu virus vaccine]; Penicillins; Gabapentin;  Tapentadol; Methocarbamol; Morphine; and Tizanidine   Social History   Socioeconomic History  . Marital status: Widowed    Spouse name: Not on file  . Number of children: Not on file  . Years of education: Not on file  . Highest education level: Not on file  Occupational History  . Not on file  Social Needs  . Financial resource strain: Not on file  . Food insecurity:    Worry: Not on file    Inability: Not on file  . Transportation needs:    Medical: Not on file    Non-medical: Not on file  Tobacco Use  . Smoking status: Former Smoker    Packs/day: 1.50    Years: 30.00    Pack years: 45.00    Last attempt to quit: 2010    Years since quitting: 10.0  . Smokeless tobacco: Never Used  Substance and Sexual Activity  .  Alcohol use: Not Currently  . Drug use: Never  . Sexual activity: Not on file  Lifestyle  . Physical activity:    Days per week: Not on file    Minutes per session: Not on file  . Stress: Not on file  Relationships  . Social connections:    Talks on phone: Not on file    Gets together: Not on file    Attends religious service: Not on file    Active member of club or organization: Not on file    Attends meetings of clubs or organizations: Not on file    Relationship status: Not on file  Other Topics Concern  . Not on file  Social History Narrative  . Not on file     Family History: The patient's family history includes Bone cancer in her brother; Heart attack in her mother; Lung cancer in her brother and mother. ROS:   Please see the history of present illness.    All other systems reviewed and are negative.  EKGs/Labs/Other Studies Reviewed:    The following studies were reviewed today:  EKG:  EKG ordered today.  The ekg ordered today demonstrates sinus rhythm and normal  Recent Labs: 03/26/2018: ALT 75; BUN 7; Potassium 3.8; Sodium 138 03/27/2018: Creatinine, Ser 0.69; Hemoglobin 11.6; Platelets 241  Recent Lipid Panel    Component Value  Date/Time   CHOL 198 02/06/2018 1110   TRIG 167 (H) 02/06/2018 1110   HDL 62 02/06/2018 1110   CHOLHDL 3.2 02/06/2018 1110   LDLCALC 103 (H) 02/06/2018 1110    Physical Exam:    VS:  BP 90/72 (BP Location: Right Arm, Patient Position: Sitting, Cuff Size: Normal)   Pulse 78   Ht 5\' 5"  (1.651 m)   Wt 174 lb 2 oz (79 kg)   SpO2 95%   BMI 28.98 kg/m     Wt Readings from Last 3 Encounters:  09/17/18 174 lb 2 oz (79 kg)  03/27/18 170 lb (77.1 kg)  02/06/18 170 lb (77.1 kg)     GEN:  Well nourished, well developed in no acute distress HEENT: Normal NECK: No JVD; No carotid bruits LYMPHATICS: No lymphadenopathy CARDIAC: RRR, no murmurs, rubs, gallops RESPIRATORY:  Clear to auscultation without rales, wheezing or rhonchi  ABDOMEN: Soft, non-tender, non-distended MUSCULOSKELETAL:  No edema; No deformity  SKIN: Warm and dry NEUROLOGIC:  Alert and oriented x 3 PSYCHIATRIC:  Normal affect    Signed, Shirlee More, MD  09/17/2018 11:35 AM    [place]

## 2018-09-17 ENCOUNTER — Ambulatory Visit (INDEPENDENT_AMBULATORY_CARE_PROVIDER_SITE_OTHER): Payer: Medicare Other | Admitting: Cardiology

## 2018-09-17 ENCOUNTER — Encounter: Payer: Self-pay | Admitting: Cardiology

## 2018-09-17 VITALS — BP 90/72 | HR 78 | Ht 65.0 in | Wt 174.1 lb

## 2018-09-17 DIAGNOSIS — J449 Chronic obstructive pulmonary disease, unspecified: Secondary | ICD-10-CM | POA: Diagnosis not present

## 2018-09-17 DIAGNOSIS — R079 Chest pain, unspecified: Secondary | ICD-10-CM

## 2018-09-17 DIAGNOSIS — I209 Angina pectoris, unspecified: Secondary | ICD-10-CM | POA: Diagnosis not present

## 2018-09-17 MED ORDER — METOPROLOL TARTRATE 50 MG PO TABS: 100.0000 mg | ORAL_TABLET | Freq: Once | ORAL | 0 refills | Status: DC

## 2018-09-17 NOTE — Patient Instructions (Signed)
Medication Instructions:  Your physician recommends that you continue on your current medications as directed. Please refer to the Current Medication list given to you today.  If you need a refill on your cardiac medications before your next appointment, please call your pharmacy.   Lab work: Your physician recommends that you return for lab work within 3-7 days before your cardiac CTA: BMP. You can return to our office for lab work, no appointment needed. You do not need to fast beforehand.   If you have labs (blood work) drawn today and your tests are completely normal, you will receive your results only by: Marland Kitchen MyChart Message (if you have MyChart) OR . A paper copy in the mail If you have any lab test that is abnormal or we need to change your treatment, we will call you to review the results.  Testing/Procedures: You had an EKG today.   Your physician has requested that you have cardiac CT. Cardiac computed tomography (CT) is a painless test that uses an x-ray machine to take clear, detailed pictures of your heart. For further information please visit [employer]ugeFiesta[employer]tn. Please follow instruction sheet as given.  Please arrive at the [facility] main entrance of [facility] at xx:xx AM (30-45 minutes prior to test start time)  [facility] [address], [place] [address] [phone]  Proceed to the [facility] Radiology Department (First Floor).  Please follow these instructions carefully (unless otherwise directed):   On the Night Before the Test: . Be sure to Drink plenty of water. . Do not consume any caffeinated/decaffeinated beverages or chocolate 12 hours prior to your test. . Do not take any antihistamines 12 hours prior to your test.  On the Day of the Test: . Drink plenty of water. Do not drink any water within one hour of the test. . Do not eat any food 4 hours prior to the test. . You may take your regular medications prior to the  test.  . Take metoprolol (Lopressor) two hours prior to test.                  -If HR is less than 55 BPM- No Beta Blocker                -IF HR is greater than 55 BPM and patient is less than or equal to 38 yrs old Lopressor 100mg  x1.                -If HR is greater than 55 BPM and patient is greater than 74 yrs old Lopressor 50 mg x1.         After the Test: . Drink plenty of water. . After receiving IV contrast, you may experience a mild flushed feeling. This is normal. . On occasion, you may experience a mild rash up to 24 hours after the test. This is not dangerous. If this occurs, you can take Benadryl 25 mg and increase your fluid intake. . If you experience trouble breathing, this can be serious. If it is severe call 911 IMMEDIATELY. If it is mild, please call our office.    Follow-Up: At [facility], you and your health needs are our priority.  As part of our continuing mission to provide you with exceptional heart care, we have created designated Provider Care Teams.  These Care Teams include your primary Cardiologist (physician) and Advanced Practice Providers (APPs -  Physician Assistants and Nurse Practitioners) who all work together  to provide you with the care you need, when you need it. You will need a follow up appointment in 3 months.  Please call our office 2 months in advance to schedule this appointment.      Cardiac CT Angiogram  A cardiac CT angiogram is a procedure to look at the heart and the area around the heart. It may be done to help find the cause of chest pains or other symptoms of heart disease. During this procedure, a large X-ray machine, called a CT scanner, takes detailed pictures of the heart and the surrounding area after a dye (contrast material) has been injected into blood vessels in the area. The procedure is also sometimes called a coronary CT angiogram, coronary artery scanning, or CTA. A cardiac CT angiogram allows the health care provider  to see how well blood is flowing to and from the heart. The health care provider will be able to see if there are any problems, such as:  Blockage or narrowing of the coronary arteries in the heart.  Fluid around the heart.  Signs of weakness or disease in the muscles, valves, and tissues of the heart. Tell a health care provider about:  Any allergies you have. This is especially important if you have had a previous allergic reaction to contrast dye.  All medicines you are taking, including vitamins, herbs, eye drops, creams, and over-the-counter medicines.  Any blood disorders you have.  Any surgeries you have had.  Any medical conditions you have.  Whether you are pregnant or may be pregnant.  Any anxiety disorders, chronic pain, or other conditions you have that may increase your stress or prevent you from lying still. What are the risks? Generally, this is a safe procedure. However, problems may occur, including:  Bleeding.  Infection.  Allergic reactions to medicines or dyes.  Damage to other structures or organs.  Kidney damage from the dye or contrast that is used.  Increased risk of cancer from radiation exposure. This risk is low. Talk with your health care provider about: ? The risks and benefits of testing. ? How you can receive the lowest dose of radiation. What happens before the procedure?  Wear comfortable clothing and remove any jewelry, glasses, dentures, and hearing aids.  Follow instructions from your health care provider about eating and drinking. This may include: ? For 12 hours before the test - avoid caffeine. This includes tea, coffee, soda, energy drinks, and diet pills. Drink plenty of water or other fluids that do not have caffeine in them. Being well-hydrated can prevent complications. ? For 4-6 hours before the test - stop eating and drinking. The contrast dye can cause nausea, but this is less likely if your stomach is empty.  Ask your  health care provider about changing or stopping your regular medicines. This is especially important if you are taking diabetes medicines, blood thinners, or medicines to treat erectile dysfunction. What happens during the procedure?  Hair on your chest may need to be removed so that small sticky patches called electrodes can be placed on your chest. These will transmit information that helps to monitor your heart during the test.  An IV tube will be inserted into one of your veins.  You might be given a medicine to control your heart rate during the test. This will help to ensure that good images are obtained.  You will be asked to lie on an exam table. This table will slide in and out of the CT  machine during the procedure.  Contrast dye will be injected into the IV tube. You might feel warm, or you may get a metallic taste in your mouth.  You will be given a medicine (nitroglycerin) to relax (dilate) the arteries in your heart.  The table that you are lying on will move into the CT machine tunnel for the scan.  The person running the machine will give you instructions while the scans are being done. You may be asked to: ? Keep your arms above your head. ? Hold your breath. ? Stay very still, even if the table is moving.  When the scanning is complete, you will be moved out of the machine.  The IV tube will be removed. The procedure may vary among health care providers and hospitals. What happens after the procedure?  You might feel warm, or you may get a metallic taste in your mouth from the contrast dye.  You may have a headache from the nitroglycerin.  After the procedure, drink water or other fluids to wash (flush) the contrast material out of your body.  Contact a health care provider if you have any symptoms of allergy to the contrast. These symptoms include: ? Shortness of breath. ? Rash or hives. ? A racing heartbeat.  Most people can return to their normal activities  right after the procedure. Ask your health care provider what activities are safe for you.  It is up to you to get the results of your procedure. Ask your health care provider, or the department that is doing the procedure, when your results will be ready. Summary  A cardiac CT angiogram is a procedure to look at the heart and the area around the heart. It may be done to help find the cause of chest pains or other symptoms of heart disease.  During this procedure, a large X-ray machine, called a CT scanner, takes detailed pictures of the heart and the surrounding area after a dye (contrast material) has been injected into blood vessels in the area.  Ask your health care provider about changing or stopping your regular medicines before the procedure. This is especially important if you are taking diabetes medicines, blood thinners, or medicines to treat erectile dysfunction.  After the procedure, drink water or other fluids to wash (flush) the contrast material out of your body. This information is not intended to replace advice given to you by your health care provider. Make sure you discuss any questions you have with your health care provider. Document Released: 08/10/2008 Document Revised: 07/17/2016 Document Reviewed: 07/17/2016 Elsevier Interactive Patient Education  2019 [employer].

## 2018-10-08 ENCOUNTER — Other Ambulatory Visit: Payer: Self-pay | Admitting: Cardiology

## 2018-10-08 DIAGNOSIS — I209 Angina pectoris, unspecified: Secondary | ICD-10-CM

## 2018-10-08 DIAGNOSIS — R079 Chest pain, unspecified: Secondary | ICD-10-CM

## 2018-10-08 DIAGNOSIS — R0602 Shortness of breath: Secondary | ICD-10-CM

## 2018-10-21 ENCOUNTER — Telehealth (HOSPITAL_COMMUNITY): Payer: Self-pay | Admitting: Emergency Medicine

## 2018-10-21 NOTE — Telephone Encounter (Signed)
Left message on voicemail with name and callback number Barkley Kratochvil RN Navigator Cardiac Imaging [facility] [phone] Office [phone] Cell  

## 2018-10-23 ENCOUNTER — Ambulatory Visit (HOSPITAL_COMMUNITY): Payer: Medicare Other

## 2018-10-23 ENCOUNTER — Other Ambulatory Visit: Payer: Self-pay | Admitting: *Deleted

## 2018-10-23 ENCOUNTER — Ambulatory Visit (HOSPITAL_COMMUNITY): Admission: RE | Admit: 2018-10-23 | Payer: Medicare Other | Source: Ambulatory Visit

## 2018-10-23 MED ORDER — METOPROLOL TARTRATE 50 MG PO TABS: 100.0000 mg | ORAL_TABLET | Freq: Once | ORAL | 0 refills | Status: DC

## 2018-10-23 NOTE — Telephone Encounter (Signed)
Patient informed that one time dose of lopressor has been resent to [facility] in [place] as requested for her to take before her cardiac CTA. Patient verbalized understanding. She will go to the [place] office for pre-procedure lab work either tomorrow or Friday, no appointment needed. No further questions.

## 2018-10-24 ENCOUNTER — Other Ambulatory Visit: Payer: Self-pay

## 2018-10-25 ENCOUNTER — Telehealth (HOSPITAL_COMMUNITY): Payer: Self-pay | Admitting: Emergency Medicine

## 2018-10-25 NOTE — Telephone Encounter (Signed)
Left message on voicemail with name and callback number Shivaan Tierno RN Navigator Cardiac Imaging [facility] [phone] Office [phone] Cell  

## 2018-10-29 ENCOUNTER — Ambulatory Visit (HOSPITAL_COMMUNITY)
Admission: RE | Admit: 2018-10-29 | Discharge: 2018-10-29 | Disposition: A | Discharge status: Home / Self Care | Payer: Medicare Other | Source: Ambulatory Visit | Attending: Cardiology | Admitting: Cardiology

## 2018-10-29 DIAGNOSIS — I209 Angina pectoris, unspecified: Secondary | ICD-10-CM | POA: Diagnosis present

## 2018-10-29 DIAGNOSIS — R079 Chest pain, unspecified: Secondary | ICD-10-CM | POA: Diagnosis present

## 2018-10-29 MED ORDER — NITROGLYCERIN 0.4 MG SL SUBL
SUBLINGUAL_TABLET | SUBLINGUAL | Status: AC
  Filled 2018-10-29: qty 2

## 2018-10-29 MED ORDER — NITROGLYCERIN 0.4 MG SL SUBL
0.8000 mg | SUBLINGUAL_TABLET | Freq: Once | SUBLINGUAL | Status: AC
  Administered 2018-10-29: 0.8 mg via SUBLINGUAL
  Filled 2018-10-29: qty 25

## 2018-10-29 MED ORDER — IOPAMIDOL (ISOVUE-370) INJECTION 76%
80.0000 mL | Freq: Once | INTRAVENOUS | Status: AC | PRN
  Administered 2018-10-29: 80 mL via INTRAVENOUS

## 2018-10-29 NOTE — Progress Notes (Signed)
Patient ambulatory out of department with family with steady gait noted. Denies any complaints.

## 2018-10-29 NOTE — Progress Notes (Signed)
CT complete. Patient complains of headache. Patient offered snack and beverage.

## 2018-10-30 NOTE — Progress Notes (Signed)
[name] met inclusion and exclusion criteria.  The informed consent form, study requirements and expectations were reviewed with the subject and questions and concerns were addressed prior to the signing of the consent form.  The subject verbalized understanding of the trial requirements.  The subject agreed to participate in the CADFEM trial and signed the informed consent on 10/29/2018.  The informed consent was obtained prior to performance of any protocol-specific procedures for the subject.  A copy of the signed informed consent was given to the subject and a copy was placed in the subject's medical record.   Dionne Bucy.[place]

## 2018-12-12 ENCOUNTER — Telehealth: Payer: Self-pay | Admitting: Cardiology

## 2018-12-12 NOTE — Telephone Encounter (Signed)
Left message for patient to call office to set up possible Televisit.

## 2018-12-18 ENCOUNTER — Telehealth: Payer: Self-pay | Admitting: Cardiology

## 2018-12-18 NOTE — Telephone Encounter (Signed)
Cardiac Questionnaire:    Since your last visit or hospitalization:    1. Have you been having new or worsening chest pain? no   2. Have you been having new or worsening shortness of breath? no 3. Have you been having new or worsening leg swelling, wt gain, or increase in abdominal girth (pants fitting more tightly)? no   4. Have you had any passing out spells? no    *A YES to any of these questions would result in the appointment being kept. *If all the answers to these questions are NO, we should indicate that given the current situation regarding the worldwide coronarvirus pandemic, at the recommendation of the CDC, we are looking to limit gatherings in our waiting area, and thus will reschedule their appointment beyond four weeks from today.   _____________   [facility] Pre-Screening Questions:   Do you currently have a fever? no (yes = cancel and refer to pcp for e-visit)  Have you recently travelled on a cruise, internationally, or to [place], [place], [place], [place], [place], or [place], [place]) ? no (yes = cancel, stay home, monitor symptoms, and contact pcp or initiate e-visit if symptoms develop)  Have you been in contact with someone that is currently pending confirmation of Covid19 testing or has been confirmed to have the [place] virus?  no (yes = cancel, stay home, away from tested individual, monitor symptoms, and contact pcp or initiate e-visit if symptoms develop)  Are you currently experiencing fatigue or cough? no (yes = pt should be prepared to have a mask placed at the time of their visit).      Virtual Visit Pre-Appointment Phone Call  Steps For Call:  1. Confirm consent - "In the setting of the current Covid19 crisis, you are scheduled for a (phone or video) visit with your provider on (date) at (time).  Just as we do with many in-office visits, in order for you to participate in this visit, we must obtain consent.  If you'd like, I can send this to your mychart (if  signed up) or email for you to review.  Otherwise, I can obtain your verbal consent now.  All virtual visits are billed to your insurance company just like a normal visit would be.  By agreeing to a virtual visit, we'd like you to understand that the technology does not allow for your provider to perform an examination, and thus may limit your provider's ability to fully assess your condition.  Finally, though the technology is pretty good, we cannot assure that it will always work on either your or our end, and in the setting of a video visit, we may have to convert it to a phone-only visit.  In either situation, we cannot ensure that we have a secure connection.  Are you willing to proceed?"  2. Give patient instructions for WebEx download to smartphone as below if video visit  3. Advise patient to be prepared with any vital sign or heart rhythm information, their current medicines, and a piece of paper and pen handy for any instructions they may receive the day of their visit  4. Inform patient they will receive a phone call 15 minutes prior to their appointment time (may be from unknown caller ID) so they should be prepared to answer  5. Confirm that appointment type is correct in Epic appointment notes (video vs telephone)    TELEPHONE CALL NOTE  Melissa Conley has been deemed a candidate for a follow-up tele-health  visit to limit community exposure during the Covid-19 pandemic. I spoke with the patient via phone to ensure availability of phone/video source, confirm preferred email & phone number, and discuss instructions and expectations.  I reminded Melissa Conley to be prepared with any vital sign and/or heart rhythm information that could potentially be obtained via home monitoring, at the time of her visit. I reminded Melissa Conley to expect a phone call at the time of her visit if her visit.  Did the patient verbally acknowledge consent to treatment? YES/verbal  Melissa Conley 12/18/2018 11:03 AM   DOWNLOADING THE WEBEX SOFTWARE TO SMARTPHONE  - If Apple, go to [employer] and type in WebEx in the search bar. [place] [employer], the blue/green circle. The app is free but as with any other app downloads, their phone may require them to verify saved payment information or Apple password. The patient does NOT have to create an account.  - If Android, ask patient to go to [employer] and type in WebEx in the search bar. [place] [employer], the blue/green circle. The app is free but as with any other app downloads, their phone may require them to verify saved payment information or Android password. The patient does NOT have to create an account.   CONSENT FOR TELE-HEALTH VISIT - PLEASE REVIEW  I hereby voluntarily request, consent and authorize [facility] and its employed or contracted physicians, physician assistants, nurse practitioners or other licensed health care professionals (the Practitioner), to provide me with telemedicine health care services (the Services") as deemed necessary by the treating Practitioner. I acknowledge and consent to receive the Services by the Practitioner via telemedicine. I understand that the telemedicine visit will involve communicating with the Practitioner through live audiovisual communication technology and the disclosure of certain medical information by electronic transmission. I acknowledge that I have been given the opportunity to request an in-person assessment or other available alternative prior to the telemedicine visit and am voluntarily participating in the telemedicine visit.  I understand that I have the right to withhold or withdraw my consent to the use of telemedicine in the course of my care at any time, without affecting my right to future care or treatment, and that the Practitioner or I may terminate the telemedicine visit at any time. I understand that I have the right to inspect  all information obtained and/or recorded in the course of the telemedicine visit and may receive copies of available information for a reasonable fee.  I understand that some of the potential risks of receiving the Services via telemedicine include:   Delay or interruption in medical evaluation due to technological equipment failure or disruption;  Information transmitted may not be sufficient (e.g. poor resolution of images) to allow for appropriate medical decision making by the Practitioner; and/or   In rare instances, security protocols could fail, causing a breach of personal health information.  Furthermore, I acknowledge that it is my responsibility to provide information about my medical history, conditions and care that is complete and accurate to the best of my ability. I acknowledge that Practitioner's advice, recommendations, and/or decision may be based on factors not within their control, such as incomplete or inaccurate data provided by me or distortions of diagnostic images or specimens that may result from electronic transmissions. I understand that the practice of medicine is not an exact science and that Practitioner makes no warranties or guarantees regarding treatment outcomes. I acknowledge that I  will receive a copy of this consent concurrently upon execution via email to the email address I last provided but may also request a printed copy by calling the office of [facility].    I understand that my insurance will be billed for this visit.   I have read or had this consent read to me.  I understand the contents of this consent, which adequately explains the benefits and risks of the Services being provided via telemedicine.   I have been provided ample opportunity to ask questions regarding this consent and the Services and have had my questions answered to my satisfaction.  I give my informed consent for the services to be provided through the use of telemedicine in my  medical care  By participating in this telemedicine visit I agree to the above.

## 2018-12-24 ENCOUNTER — Other Ambulatory Visit: Payer: Self-pay

## 2018-12-24 ENCOUNTER — Encounter: Payer: Self-pay | Admitting: Cardiology

## 2018-12-24 ENCOUNTER — Telehealth (INDEPENDENT_AMBULATORY_CARE_PROVIDER_SITE_OTHER): Payer: Medicare Other | Admitting: Cardiology

## 2018-12-24 VITALS — BP 90/60 | HR 79 | Ht 65.0 in

## 2018-12-24 DIAGNOSIS — I959 Hypotension, unspecified: Secondary | ICD-10-CM

## 2018-12-24 DIAGNOSIS — R079 Chest pain, unspecified: Secondary | ICD-10-CM | POA: Diagnosis not present

## 2018-12-24 DIAGNOSIS — Z7189 Other specified counseling: Secondary | ICD-10-CM

## 2018-12-24 DIAGNOSIS — J449 Chronic obstructive pulmonary disease, unspecified: Secondary | ICD-10-CM

## 2018-12-24 NOTE — Patient Instructions (Signed)
Medication Instructions:  Your physician recommends that you continue on your current medications as directed. Please refer to the Current Medication list given to you today.  If you need a refill on your cardiac medications before your next appointment, please call your pharmacy.   Lab work: NONE If you have labs (blood work) drawn today and your tests are completely normal, you will receive your results only by: Marland Kitchen MyChart Message (if you have MyChart) OR . A paper copy in the mail If you have any lab test that is abnormal or we need to change your treatment, we will call you to review the results.  Testing/Procedures: NONE  Follow-Up: AS NEEDED with Dr. Bettina Gavia

## 2018-12-24 NOTE — Progress Notes (Signed)
Virtual Visit via Video Note   This visit type was conducted due to national recommendations for restrictions regarding the COVID-19 Pandemic (e.g. social distancing) in an effort to limit this patient's exposure and mitigate transmission in our community.  Due to her co-morbid illnesses, this patient is at least at moderate risk for complications without adequate follow up.  This format is felt to be most appropriate for this patient at this time.  All issues noted in this document were discussed and addressed.  A limited physical exam was performed with this format.  Please refer to the patient's chart for her consent to telehealth for [facility].   Evaluation Performed:  Follow-up visit  Date:  12/24/2018   ID:  Melissa Conley, Melissa Conley August 22, 1961, MRN [id number]  Patient Location: Home  Provider Location: Home  PCP:  Welford Roche, NP  Cardiologist:  Quay Burow, MD Dr Bettina Gavia Electrophysiologist:  None   Chief Complaint: Follow-up after cardiac CTA  History of Present Illness:    Melissa Conley is a 58 y.o. female who presents via audio/video conferencing for a telehealth visit today.    Melissa Conley is a 58 y.o. female with a hx of COPD, chronic pain and narcotic therapy and normal coronary arteriography 08/25/09    The patient does not have symptoms concerning for COVID-19 infection (fever, chills, cough, or new shortness of breath).   Fortunately she has had no episodes of severe chest pain and has not needed nitroglycerin.  We reviewed the results of her cardiac CTA that is normal and I am unsure whether her chest discomfort is part of her chronic lung disease or she is a woman with angina with normal coronary arteries.  I would give her additional antianginal medications.  She continues to have blood pressures less than 90 systolic both arms and feels weak with which she will continue to have dietary sodium I asked her that this does not work to contact me we could start her  on an alpha agonist midodrine.  She does complain of dependent edema at the end of the day.  Presently she is not short of breath ambulating in her home and she is not leaving the home in the current COVID-19 environment.  No palpitation syncope or TIA  Past Medical History:  Diagnosis Date  . Acid reflux   . Angina at rest [facility])   . Anxiety    hx (03/26/2018)  . Bulging lumbar disc 03/28/2016   Overview:  L4-5, left  . Chronic bronchitis ([place])   . Chronic lower back pain   . Chronic pain syndrome 11/02/2015  . Common migraine    "monthly" (03/26/2018)  . COPD (chronic obstructive pulmonary disease) ([place]) 01/28/2018  . Dysuria 01/31/2017  . Fall from high place 03/25/2018   "deck; fell ~ 43ft; broke both feet"  . Frequent headaches    "qod" (03/26/2018)  . GERD without esophagitis 01/28/2018  . History of abnormal mammogram 12/20/2016  . IBS (irritable bowel syndrome)   . Lumbar radiculopathy 11/02/2015  . Menopausal disorder 01/05/2016  . Neck pain 11/02/2015  . Numbness and tingling of both legs below knees 02/10/2016  . Ovarian cancer ([place])   . Pneumonia    "twice" (03/26/2018)  . Postmenopausal HRT (hormone replacement therapy) 01/31/2017  . 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 Thoracic back pain 11/02/2015  . Unstable angina ([place]) 01/28/2018   Past Surgical History:  Procedure Laterality Date  . BACK SURGERY    .  CARDIAC CATHETERIZATION    . CHOLECYSTECTOMY OPEN    . DILATION AND CURETTAGE OF UTERUS    . ORIF ANKLE FRACTURE Right 03/27/2018   Procedure: OPEN REDUCTION INTERNAL FIXATION (ORIF) ANKLE FRACTURE;  Surgeon: Dorna Leitz, MD;  Location: [facility];  Service: Orthopedics;  Laterality: Right;  . POSTERIOR LUMBAR FUSION  2000  . TUBAL LIGATION    . VAGINAL HYSTERECTOMY       Current Meds  Medication Sig  . albuterol (PROAIR HFA) 108 (90 Base) MCG/ACT inhaler Inhale 1 puff into the lungs 4 (four) times daily as needed for shortness of  breath.   Marland Kitchen apixaban (ELIQUIS) 2.5 MG TABS tablet Take 1 tablet (2.5 mg total) by mouth 2 (two) times daily.  . cyanocobalamin (,VITAMIN B-12,) 1000 MCG/ML injection Inject 1 mL into the muscle every 30 (thirty) days.  Marland Kitchen EPINEPHrine 0.3 mg/0.3 mL IJ SOAJ injection Inject 0.3 mg into the muscle as directed.  . estrogens, conjugated, (PREMARIN) 0.625 MG tablet Take 0.625 mg by mouth daily.  . fluticasone (FLONASE) 50 MCG/ACT nasal spray Place 1 spray into both nostrils daily as needed for allergies or rhinitis.   Marland Kitchen loratadine (CLARITIN) 10 MG tablet Take 10 mg by mouth daily as needed for allergies.   . Multiple Vitamin (MULTIVITAMIN) tablet Take 1 tablet by mouth daily.  . nitroGLYCERIN (NITROSTAT) 0.4 MG SL tablet Place 0.4 mg every 5 (five) minutes as needed under the tongue for chest pain.  Marland Kitchen oxyCODONE (ROXICODONE) 15 MG immediate release tablet Take 15 mg every 4 (four) hours as needed by mouth for pain.  . promethazine (PHENERGAN) 25 MG tablet Take 1 tablet (25 mg total) by mouth every 6 (six) hours as needed for nausea or vomiting.  Marland Kitchen tiZANidine (ZANAFLEX) 4 MG tablet Take 1 tablet (4 mg total) by mouth every 8 (eight) hours as needed for muscle spasms.     Allergies:   Fluzone [flu virus vaccine]; Penicillins; Gabapentin; Tapentadol; Methocarbamol; Morphine; and Tizanidine   Social History   Tobacco Use  . Smoking status: Former Smoker    Packs/day: 1.50    Years: 30.00    Pack years: 45.00    Last attempt to quit: 2010    Years since quitting: 10.2  . Smokeless tobacco: Never Used  Substance Use Topics  . Alcohol use: Not Currently  . Drug use: Never     Family Hx: The patient's family history includes Bone cancer in her brother; Heart attack in her mother; Lung cancer in her brother and mother.  ROS:   Please see the history of present illness.     All other systems reviewed and are negative.   Prior CV studies:   The following studies were reviewed today: 10/29/18  cardiac CTA Calcium Score: 19.6 Agatston units. Coronary Arteries: Right dominant with no anomalies LM: Calcified plaque distal left main, no significant stenosis. LAD system:  No plaque or stenosis. Circumflex system: No plaque or stenosis. RCA system: No plaque or stenosis. IMPRESSION: 1. Coronary artery calcium score 19.6 Agatston units. This places the patient in the 86th percentile for age and gender, suggesting high risk for future cardiac events. 2.  No obstructive CAD.   Labs/Other Tests and Data Reviewed:    EKG:  No ECG reviewed.  Recent Labs: 03/26/2018: ALT 75; BUN 7; Potassium 3.8; Sodium 138 03/27/2018: Creatinine, Ser 0.69; Hemoglobin 11.6; Platelets 241   Recent Lipid Panel Lab Results  Component Value Date/Time   CHOL 198 02/06/2018 11:10 AM  TRIG 167 (H) 02/06/2018 11:10 AM   HDL 62 02/06/2018 11:10 AM   CHOLHDL 3.2 02/06/2018 11:10 AM   LDLCALC 103 (H) 02/06/2018 11:10 AM    Wt Readings from Last 3 Encounters:  09/17/18 174 lb 2 oz (79 kg)  03/27/18 170 lb (77.1 kg)  02/06/18 170 lb (77.1 kg)     Objective:    Vital Signs:  BP 90/60 (BP Location: Left Arm, Patient Position: Standing)   Pulse 79   Ht 5\' 5"  (1.651 m)   BMI 28.98 kg/m    Constitutional, well-nourished well-developed in no acute distress Vital signs reviewed Eyes, conjunctiva and sclera are normal without pallor or icterus extraocular motions intact and normal there is no lid lag Respiratory, normal effort and excursion no audible wheezing without a stethoscope Cardiovascular, no neck vein distention or peripheral edema Skin, no rash skin lesion or ulceration of the extremities Neurologic, cranial nerves II to XII are grossly intact and the patient moves all 4 extremities Neuro/Psychiatric, judgment and thought processes are intact and coherent, alert and oriented x3, mood and affect appear normal. ASSESSMENT & PLAN:    1. Chest pain, normal cardiac CTA she can use nitroglycerin  as needed at this time I would not give her additional antianginal medications based upon the results of her cardiac CTA and relatively low blood pressure.  We discussed follow-up and she prefers to see me in the future as needed 2. COPD stable managed by PCP pulmonary 3. Hypotension ongoing if needed alpha agonist midodrine 5 mg twice daily could be helpful.  COVID-19 Education: The signs and symptoms of COVID-19 were discussed with the patient and how to seek care for testing (follow up with PCP or arrange E-visit).  The importance of social distancing was discussed today.  Time:   Today, I have spent 25 minutes with the patient with telehealth technology discussing the above problems.     Medication Adjustments/Labs and Tests Ordered: Current medicines are reviewed at length with the patient today.  Concerns regarding medicines are outlined above.   Tests Ordered: No orders of the defined types were placed in this encounter.   Medication Changes: No orders of the defined types were placed in this encounter.   Disposition:  Follow up prn  Signed, Shirlee More, MD  12/24/2018 9:56 AM    [facility]

## 2019-03-23 IMAGING — CT CT HEART MORP W/ CTA COR W/ SCORE W/ CA W/CM &/OR W/O CM
4 of 7 series · 8 of 20 positions shown, 9 images · IV contrast (APPLIED)
Comparison: None.

Addendum:
EXAM:
OVER-READ INTERPRETATION  CT CHEST

The following report is an over-read performed by radiologist Dr.
Kevin Gz [REDACTED] on 10/29/2018. This
over-read does not include interpretation of cardiac or coronary
anatomy or pathology. The coronary calcium score/coronary CTA
interpretation by the cardiologist is attached.
CLINICAL DATA: Chest pain
Cardiac CTA
MEDICATIONS:
Sub lingual nitro. 4mg x 2
TECHNIQUE: The patient was scanned on a Siemens [REDACTED]ice scanner. Gantry
rotation speed was 250 msecs. Collimation was 0.6 mm. A 100 kV
prospective scan was triggered in the ascending thoracic aorta at
35-75% of the R-R interval. Average HR during the scan was 60 bpm.
The 3D data set was interpreted on a dedicated work station using
MPR, MIP and VRT modes. A total of 80cc of contrast was used.

[Series 6: best diast 76 % · axial · 0.39mm/px · z∈[+1206,+1246]mm · 2 of 301 slices shown, 3 images]
[im 101/301  vessel]
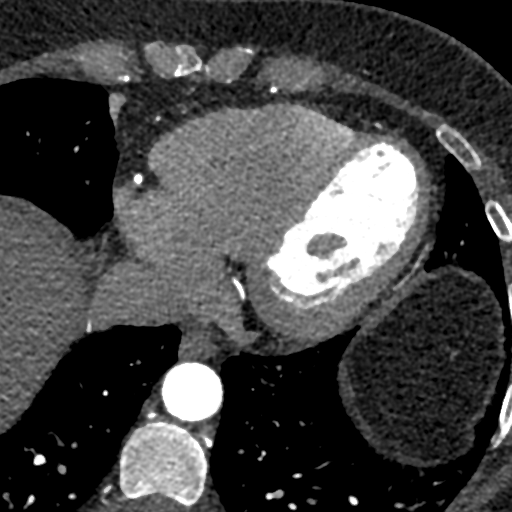
[im 101/301  lung]
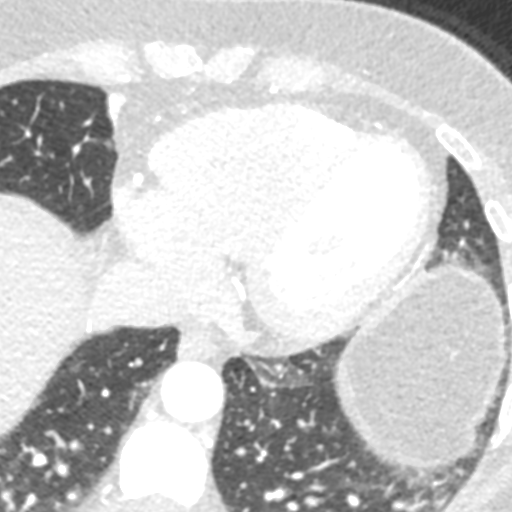
[im 201/301  vessel]
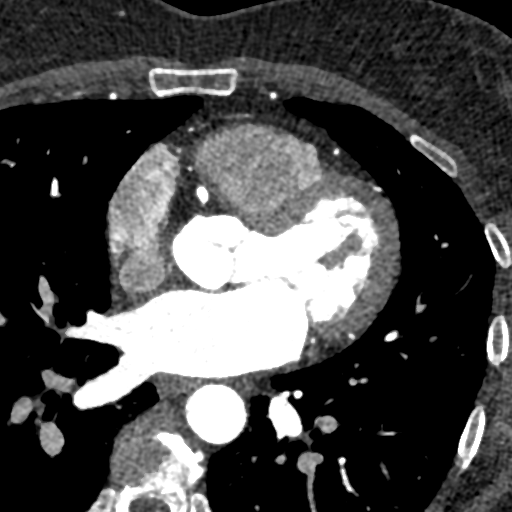

[Series 7: best syst 45 % · axial · 0.39mm/px · z∈[+1206,+1246]mm · 2 of 301 slices shown]
[im 101/301  vessel]
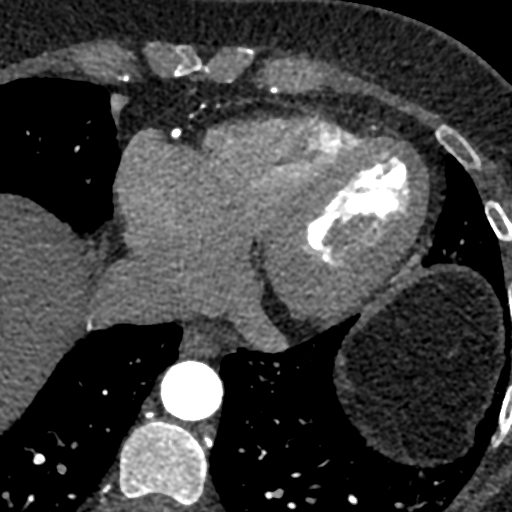
[im 201/301  vessel]
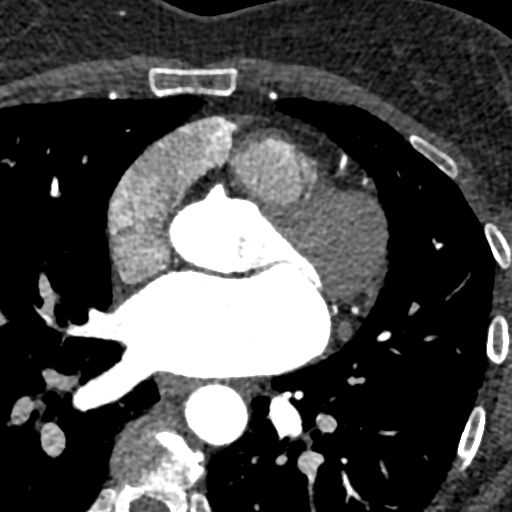

[Series 8: ts diast sharp 76 % · axial · 0.39mm/px · z∈[+1206,+1246]mm · 2 of 301 slices shown]
[im 101/301  lung]
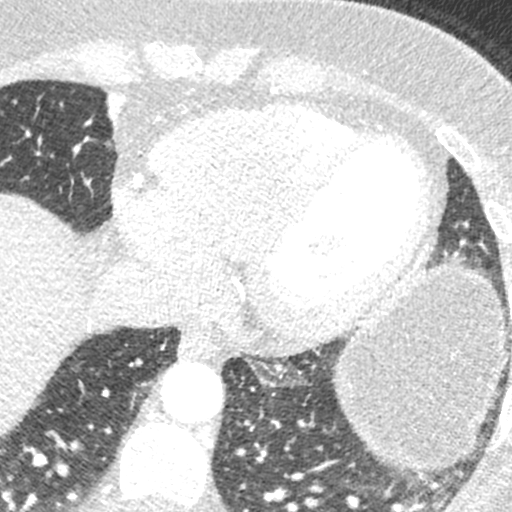
[im 201/301  lung]
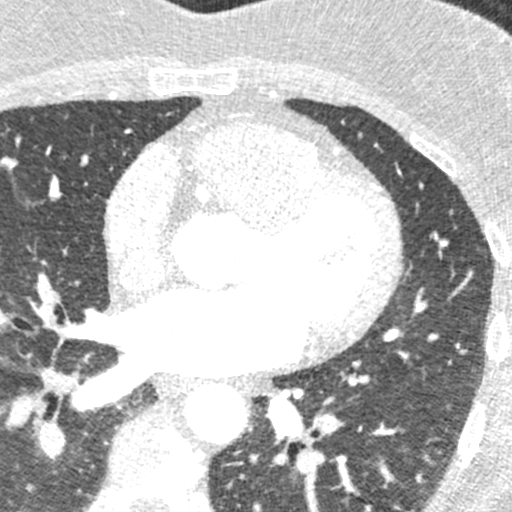

[Series 9: ts syst sharp 45 % · axial · 0.39mm/px · z∈[+1206,+1246]mm · 2 of 301 slices shown]
[im 101/301  lung]
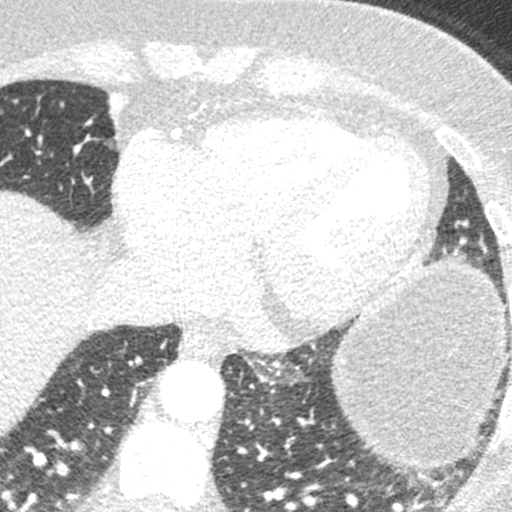
[im 201/301  lung]
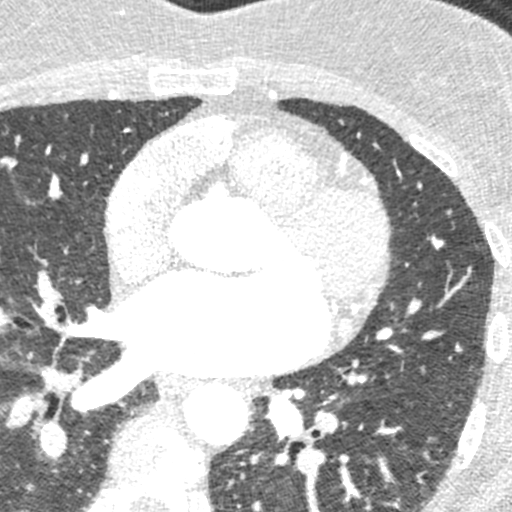

[8 of 20 positions shown; findings below may reference images not displayed]

FINDINGS: Within the visualized portions of the thorax there are no suspicious
appearing pulmonary nodules or masses, there is no acute
consolidative airspace disease, no pleural effusions, no
pneumothorax and no lymphadenopathy. Visualized portions of the
upper abdomen are unremarkable. There are no aggressive appearing
lytic or blastic lesions noted in the visualized portions of the
skeleton.
IMPRESSION: No significant incidental noncardiac findings are noted.
FINDINGS: Non-cardiac: See separate report from [REDACTED].

Pulmonary veins drain normally to the left atrium.

Calcium Score: 19.6 Agatston units.

Coronary Arteries: Right dominant with no anomalies

LM: Calcified plaque distal left main, no significant stenosis.

LAD system:  No plaque or stenosis.

Circumflex system: No plaque or stenosis.

RCA system: No plaque or stenosis.
IMPRESSION: 1. Coronary artery calcium score 19.6 Agatston units. This places
the patient in the 86th percentile for age and gender, suggesting
high risk for future cardiac events.

2.  No obstructive CAD.

Enmanuel Bergh

*** End of Addendum ***

## 2020-09-11 DIAGNOSIS — C73 Malignant neoplasm of thyroid gland: Secondary | ICD-10-CM

## 2020-09-11 HISTORY — DX: Malignant neoplasm of thyroid gland: C73

## 2021-01-14 ENCOUNTER — Telehealth: Payer: Self-pay | Admitting: Hematology and Oncology

## 2021-01-14 NOTE — Telephone Encounter (Signed)
Patient referred by Darrol Jump, NP for Left Neck Mass.  Appt made for 01/17/21 Labs 3:00 pm - Consult 3:30 pm

## 2021-01-17 ENCOUNTER — Ambulatory Visit: Payer: Medicare Other | Admitting: Hematology and Oncology

## 2021-01-17 ENCOUNTER — Other Ambulatory Visit: Payer: Medicare Other

## 2021-01-17 ENCOUNTER — Other Ambulatory Visit: Payer: Self-pay | Admitting: Hematology and Oncology

## 2021-01-17 DIAGNOSIS — R221 Localized swelling, mass and lump, neck: Secondary | ICD-10-CM

## 2021-01-17 NOTE — Progress Notes (Deleted)
[facility]  [address] [place],  [place]  [address] [phone]  Clinic Day:  01/17/2021  Referring physician: Welford Roche, NP   CHIEF COMPLAINT:  CC: ***  Current Treatment:  ***   HISTORY OF PRESENT ILLNESS:  Melissa Conley is a 60 y.o. female with a history of *** who is referred in consultation with {REFERRING PHYSICIAN} for assessment and management. ***   REVIEW OF SYSTEMS:  Review of Systems - Oncology   VITALS:  There were no vitals taken for this visit.  Wt Readings from Last 3 Encounters:  09/17/18 174 lb 2 oz (79 kg)  03/27/18 170 lb (77.1 kg)  02/06/18 170 lb (77.1 kg)    There is no height or weight on file to calculate BMI.  Performance status (ECOG): {CHL ONC [id number]  PHYSICAL EXAM:  Physical Exam Lymph nodes:   There is no cervical, clavicular, axillary or lymphadenopathy.  LABS:   CBC Latest Ref Rng & Units 03/27/2018 03/26/2018 02/06/2018  WBC 4.0 - 10.5 K/uL 8.4 7.6 5.9  Hemoglobin 12.0 - 15.0 g/dL 11.6(L) 12.1 12.9  Hematocrit 36.0 - 46.0 % 36.7 38.4 39.8  Platelets 150 - 400 K/uL 241 211 359   CMP Latest Ref Rng & Units 03/27/2018 03/26/2018 02/06/2018  Glucose 70 - 99 mg/dL - 115(H) 85  BUN 6 - 20 mg/dL - 7 10  Creatinine 0.44 - 1.00 mg/dL 0.69 0.68 0.76  Sodium 135 - 145 mmol/L - 138 143  Potassium 3.5 - 5.1 mmol/L - 3.8 4.7  Chloride 98 - 111 mmol/L - 104 102  CO2 22 - 32 mmol/L - 25 24  Calcium 8.9 - 10.3 mg/dL - 8.8(L) 9.7  Total Protein 6.5 - 8.1 g/dL - 7.1 7.3  Total Bilirubin 0.3 - 1.2 mg/dL - 0.7 0.3  Alkaline Phos 38 - 126 U/L - 139(H) 99  AST 15 - 41 U/L - 63(H) 19  ALT 0 - 44 U/L - 75(H) 18     No results found for: CEA1 / No results found for: CEA1 No results found for: PSA1 No results found for: PIR518 No results found for: ACZ660  No results found for: TOTALPROTELP, ALBUMINELP, A1GS, A2GS, BETS, BETA2SER, GAMS, MSPIKE, SPEI No results found for: TIBC, FERRITIN,  IRONPCTSAT No results found for: LDH  STUDIES:  No results found.    HISTORY:   Past Medical History:  Diagnosis Date  . Acid reflux   . Angina at rest [facility])   . Anxiety    hx (03/26/2018)  . Bulging lumbar disc 03/28/2016   Overview:  L4-5, left  . Chronic bronchitis ([place])   . Chronic lower back pain   . Chronic pain syndrome 11/02/2015  . Common migraine    "monthly" (03/26/2018)  . COPD (chronic obstructive pulmonary disease) ([place]) 01/28/2018  . Dysuria 01/31/2017  . Fall from high place 03/25/2018   "deck; fell ~ 1ft; broke both feet"  . Frequent headaches    "qod" (03/26/2018)  . GERD without esophagitis 01/28/2018  . History of abnormal mammogram 12/20/2016  . IBS (irritable bowel syndrome)   . Lumbar radiculopathy 11/02/2015  . Menopausal disorder 01/05/2016  . Neck pain 11/02/2015  . Numbness and tingling of both legs below knees 02/10/2016  . Ovarian cancer ([place])   . Pneumonia    "twice" (03/26/2018)  . Postmenopausal HRT (hormone replacement therapy) 01/31/2017  . 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 Thoracic back pain 11/02/2015  .  Unstable angina ([place]) 01/28/2018    Past Surgical History:  Procedure Laterality Date  . BACK SURGERY    . CARDIAC CATHETERIZATION    . CHOLECYSTECTOMY OPEN    . DILATION AND CURETTAGE OF UTERUS    . ORIF ANKLE FRACTURE Right 03/27/2018   Procedure: OPEN REDUCTION INTERNAL FIXATION (ORIF) ANKLE FRACTURE;  Surgeon: Dorna Leitz, MD;  Location: [facility];  Service: Orthopedics;  Laterality: Right;  . POSTERIOR LUMBAR FUSION  2000  . TUBAL LIGATION    . VAGINAL HYSTERECTOMY      Family History  Problem Relation Age of Onset  . Heart attack Mother   . Lung cancer Mother   . Bone cancer Brother   . Lung cancer Brother     Social History:  reports that she quit smoking about 12 years ago. She has a 45.00 pack-year smoking history. She has never used smokeless tobacco. She reports previous alcohol use.  She reports that she does not use drugs.The patient is {Blank single:19197::"alone","accompanied by"} *** today.  Allergies:  Allergies  Allergen Reactions  . Fluzone [Influenza Virus Vaccine] Hives  . Penicillins Anaphylaxis and Hives    Has patient had a PCN reaction causing immediate rash, facial/tongue/throat swelling, SOB or lightheadedness with hypotension: Yes Has patient had a PCN reaction causing severe rash involving mucus membranes or skin necrosis: No Has patient had a PCN reaction that required hospitalization: Yes Has patient had a PCN reaction occurring within the last 10 years: Yes If all of the above answers are "NO", then may proceed with Cephalosporin use.    . Gabapentin Other (See Comments)    GI Upset    . Tapentadol Nausea Only  . Methocarbamol Nausea Only  . Morphine Nausea And Vomiting    Pt reports that she is not allergic to this medication  . Tizanidine Nausea Only    Current Medications: Current Outpatient Medications  Medication Sig Dispense Refill  . albuterol (PROAIR HFA) 108 (90 Base) MCG/ACT inhaler Inhale 1 puff into the lungs 4 (four) times daily as needed for shortness of breath.     Marland Kitchen apixaban (ELIQUIS) 2.5 MG TABS tablet Take 1 tablet (2.5 mg total) by mouth 2 (two) times daily. 42 tablet 0  . cyanocobalamin (,VITAMIN B-12,) 1000 MCG/ML injection Inject 1 mL into the muscle every 30 (thirty) days.    Marland Kitchen EPINEPHrine 0.3 mg/0.3 mL IJ SOAJ injection Inject 0.3 mg into the muscle as directed.    . estrogens, conjugated, (PREMARIN) 0.625 MG tablet Take 0.625 mg by mouth daily.    . fluticasone (FLONASE) 50 MCG/ACT nasal spray Place 1 spray into both nostrils daily as needed for allergies or rhinitis.     Marland Kitchen loratadine (CLARITIN) 10 MG tablet Take 10 mg by mouth daily as needed for allergies.     . Multiple Vitamin (MULTIVITAMIN) tablet Take 1 tablet by mouth daily.    . nitroGLYCERIN (NITROSTAT) 0.4 MG SL tablet Place 0.4 mg every 5 (five) minutes  as needed under the tongue for chest pain.    Marland Kitchen oxyCODONE (ROXICODONE) 15 MG immediate release tablet Take 15 mg every 4 (four) hours as needed by mouth for pain.    . promethazine (PHENERGAN) 25 MG tablet Take 1 tablet (25 mg total) by mouth every 6 (six) hours as needed for nausea or vomiting. 15 tablet 0  . tiZANidine (ZANAFLEX) 4 MG tablet Take 1 tablet (4 mg total) by mouth every 8 (eight) hours as needed for muscle spasms. 40 tablet  0   No current facility-administered medications for this visit.     ASSESSMENT & PLAN:   Assessment:  Melissa Conley is a 60 y.o. female ***  Plan: 1.  ***  I discussed the assessment and treatment plan with the patient.  The patient was provided an opportunity to ask questions and all were answered.  The patient agreed with the plan and demonstrated an understanding of the instructions.  The patient was advised to call back if the symptoms worsen or if the condition fails to improve as anticipated.  Thank you for the opportunity    I provided *** minutes (12:57 PM - 12:57 PM) of face-to-face time during this this encounter and > 50% was spent counseling as documented under my assessment and plan.    Melodye Ped, NP

## 2021-01-18 ENCOUNTER — Encounter: Payer: Self-pay | Admitting: Hematology and Oncology

## 2021-01-18 ENCOUNTER — Other Ambulatory Visit: Payer: Self-pay

## 2021-01-18 ENCOUNTER — Inpatient Hospital Stay: Payer: Medicare Other | Attending: Hematology and Oncology

## 2021-01-18 ENCOUNTER — Inpatient Hospital Stay (INDEPENDENT_AMBULATORY_CARE_PROVIDER_SITE_OTHER): Payer: Medicare Other | Admitting: Hematology and Oncology

## 2021-01-18 VITALS — BP 102/69 | HR 76 | Temp 98.3°F | Resp 18 | Ht 63.25 in | Wt 187.3 lb

## 2021-01-18 DIAGNOSIS — R221 Localized swelling, mass and lump, neck: Secondary | ICD-10-CM

## 2021-01-18 LAB — BASIC METABOLIC PANEL
BUN: 14 (ref 4–21)
CO2: 29 — AB (ref 13–22)
Chloride: 106 (ref 99–108)
Creatinine: 0.7 (ref 0.5–1.1)
Glucose: 96
Potassium: 4 (ref 3.4–5.3)
Sodium: 140 (ref 137–147)

## 2021-01-18 LAB — CBC AND DIFFERENTIAL
HCT: 39 (ref 36–46)
Hemoglobin: 13 (ref 12.0–16.0)
Neutrophils Absolute: 3.64
Platelets: 305 (ref 150–399)
WBC: 6.5

## 2021-01-18 LAB — CBC: RBC: 4.4 (ref 3.87–5.11)

## 2021-01-18 LAB — HEPATIC FUNCTION PANEL
ALT: 33 (ref 7–35)
AST: 37 — AB (ref 13–35)
Alkaline Phosphatase: 102 (ref 25–125)
Bilirubin, Total: 0.3

## 2021-01-18 LAB — COMPREHENSIVE METABOLIC PANEL
Albumin: 4.2 (ref 3.5–5.0)
Calcium: 8.9 (ref 8.7–10.7)
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
## 2021-01-19 ENCOUNTER — Telehealth: Payer: Self-pay | Admitting: Hematology and Oncology

## 2021-01-19 NOTE — Telephone Encounter (Signed)
Notified Saniya at [facility] that patient was seen yesterday by Dayton Scrape, NP

## 2021-01-20 ENCOUNTER — Telehealth: Payer: Self-pay | Admitting: Hematology and Oncology

## 2021-01-27 ENCOUNTER — Telehealth: Payer: Self-pay | Admitting: Hematology and Oncology

## 2021-01-27 NOTE — Telephone Encounter (Signed)
01/27/21 spoke with patient and rescheduled all appts

## 2021-02-03 ENCOUNTER — Inpatient Hospital Stay: Payer: Medicare Other | Admitting: Oncology

## 2021-02-10 ENCOUNTER — Inpatient Hospital Stay: Payer: Medicare Other | Admitting: Oncology

## 2021-02-10 ENCOUNTER — Telehealth: Payer: Self-pay | Admitting: Oncology

## 2021-02-10 NOTE — Telephone Encounter (Signed)
02/10/21 Spoke with patient and resched appt-due to transportation

## 2021-02-20 ENCOUNTER — Other Ambulatory Visit: Payer: Self-pay | Admitting: Oncology

## 2021-02-20 DIAGNOSIS — R221 Localized swelling, mass and lump, neck: Secondary | ICD-10-CM

## 2021-02-21 ENCOUNTER — Inpatient Hospital Stay: Payer: Medicare Other | Admitting: Oncology

## 2021-02-21 ENCOUNTER — Telehealth: Payer: Self-pay | Admitting: Oncology

## 2021-02-21 NOTE — Telephone Encounter (Signed)
Patient unable to make her New Patient Consult w/Dr Bobby Rumpf today.  Rescheduled patient to 6//14 at 2:15 pm

## 2021-02-22 ENCOUNTER — Inpatient Hospital Stay: Payer: Medicare Other | Attending: Hematology and Oncology | Admitting: Oncology

## 2021-02-22 ENCOUNTER — Telehealth: Payer: Self-pay | Admitting: Oncology

## 2021-02-22 ENCOUNTER — Other Ambulatory Visit: Payer: Self-pay

## 2021-02-22 ENCOUNTER — Other Ambulatory Visit: Payer: Self-pay | Admitting: Oncology

## 2021-02-22 DIAGNOSIS — C73 Malignant neoplasm of thyroid gland: Secondary | ICD-10-CM

## 2021-02-22 DIAGNOSIS — R221 Localized swelling, mass and lump, neck: Secondary | ICD-10-CM

## 2021-02-22 NOTE — Telephone Encounter (Signed)
02/23/21 schedule patient for genetic counseling.Appt confirmed by patient

## 2021-02-22 NOTE — Progress Notes (Signed)
[facility]  [address] [place],  [place]  [address] [phone]  Clinic Day:  02/22/2021  Referring physician:  Darrol Jump, NP   HISTORY OF PRESENT ILLNESS:  The patient is a 60 y.o. female who I was asked to consult upon for newly diagnosed thyroid cancer.  Her history dates back to earlier this year when she began noticing hair loss, weight gain and fatigue.  She also had a recent skin biopsy over her left breast come back positive for an unspecified cancer.  Due to her concerns about there being a more systemic process present, she underwent a PET scan in April 2022.  This study showed a mildly hypermetabolic left supraclavicular lymph node that was adjacent to the left lobe of her thyroid. Part of her left thyroid lobe was mildly hypermetabolic as well.  She also underwent a neck CT, which showed suspicious calcifications in both the left lobe of her thyroid and her abnormal left supraclavicular lymph node.  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   Since this biopsy was done, she has experienced some mild dysphagia.  She comes in today to discuss her pathology and its implications.    PAST MEDICAL HISTORY:   Past Medical History:  Diagnosis Date   Acid reflux    Angina at rest [facility])    Anxiety    hx (03/26/2018)   Bulging lumbar disc 03/28/2016   Overview:  L4-5, left   Chronic bronchitis (HCC)    Chronic lower back pain    Chronic pain syndrome 11/02/2015   Common migraine    "monthly" (03/26/2018)   COPD (chronic obstructive pulmonary disease) ([place]) 01/28/2018   Dysuria 01/31/2017   Fall from high place 03/25/2018   "deck; fell ~ 37ft; broke both feet"   Frequent headaches    "qod" (03/26/2018)   GERD without esophagitis 01/28/2018   History of abnormal mammogram 12/20/2016   IBS (irritable bowel syndrome)    Lumbar radiculopathy 11/02/2015   Menopausal disorder 01/05/2016   Neck  pain 11/02/2015   Numbness and tingling of both legs below knees 02/10/2016   Ovarian cancer ([place])    Pneumonia    "twice" (03/26/2018)   Postmenopausal HRT (hormone replacement therapy) 01/31/2017   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/19/2016   Thoracic back pain 11/02/2015   Unstable angina ([place]) 01/28/2018    PAST SURGICAL HISTORY:   Past Surgical History:  Procedure Laterality Date   BACK SURGERY     CARDIAC CATHETERIZATION     CHOLECYSTECTOMY OPEN     DILATION AND CURETTAGE OF UTERUS     ORIF ANKLE FRACTURE Right 03/27/2018   Procedure: OPEN REDUCTION INTERNAL FIXATION (ORIF) ANKLE FRACTURE;  Surgeon: Dorna Leitz, MD;  Location: [facility];  Service: Orthopedics;  Laterality: Right;   POSTERIOR LUMBAR FUSION  2000   TUBAL LIGATION     VAGINAL HYSTERECTOMY      CURRENT MEDICATIONS:   Current Outpatient Medications  Medication Sig Dispense Refill   albuterol (PROAIR HFA) 108 (90 Base) MCG/ACT inhaler Inhale 1 puff into the lungs 4 (four) times daily as needed for shortness of breath.      aspirin 81 MG EC tablet Take 81 mg by mouth daily.     fluticasone (FLONASE) 50 MCG/ACT nasal spray Place into the nose as needed.     Multiple Vitamin (MULTIVITAMIN) tablet Take 1 tablet by mouth daily.  oxyCODONE (ROXICODONE) 15 MG immediate release tablet Take 15 mg by mouth 4 (four) times daily. Patient takes it four times a day scheduled and not as needed     polyethylene glycol (MIRALAX / GLYCOLAX) 17 g packet Take 17 g by mouth daily.     No current facility-administered medications for this visit.    ALLERGIES:   Allergies  Allergen Reactions   Fluzone [Influenza Virus Vaccine] Hives    Could not breath, ended up being hospitalized    Penicillins Anaphylaxis and Hives    Has patient had a PCN reaction causing immediate rash, facial/tongue/throat swelling, SOB or lightheadedness with hypotension: Yes Has patient had a PCN reaction causing severe rash  involving mucus membranes or skin necrosis: No Has patient had a PCN reaction that required hospitalization: Yes Has patient had a PCN reaction occurring within the last 10 years: Yes If all of the above answers are "NO", then may proceed with Cephalosporin use.     Gabapentin Other (See Comments)    GI Upset     Tapentadol Nausea Only   Methocarbamol Nausea Only   Morphine Nausea And Vomiting    Pt reports that she is not allergic to this medication   Tizanidine Nausea Only    FAMILY HISTORY:   Family History  Problem Relation Age of Onset   Heart attack Mother    Lung cancer Mother    Lymphoma Mother    Bone cancer Brother    Lung cancer Brother    Ovarian cancer Sister    Brain cancer Sister     SOCIAL HISTORY:  The patient was raised in [place].  She lives in [place].  She is widowed, with 3 children (1 deceased) and 9 grandchildren.  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  She smoked a pack of cigarettes daily for 20 years before quitting 15 years ago.  There is no history of alcohol use.    REVIEW OF SYSTEMS:  Review of Systems  Constitutional:  Positive for fatigue. Negative for fever.  HENT:   Negative for hearing loss and sore throat.   Eyes:  Negative for eye problems.  Respiratory:  Positive for cough. Negative for chest tightness and hemoptysis.   Cardiovascular:  Negative for chest pain and palpitations.  Gastrointestinal:  Negative for abdominal distention, abdominal pain, blood in stool, constipation, diarrhea, nausea and vomiting.  Endocrine: Negative for hot flashes.  Genitourinary:  Negative for difficulty urinating, dysuria, frequency, hematuria and nocturia.   Musculoskeletal:  Positive for arthralgias and myalgias. Negative for back pain and gait problem.  Skin: Negative.  Negative for itching and rash.  Neurological:  Positive for headaches. Negative for dizziness, extremity  weakness, gait problem, light-headedness and numbness.  Hematological: Negative.   Psychiatric/Behavioral: Negative.  Negative for depression and suicidal ideas. The patient is not nervous/anxious.     PHYSICAL EXAM:  Blood pressure 101/65, pulse 74, temperature 98.9 F (37.2 C), resp. rate 16, height 5' 3.25" (1.607 m), weight 189 lb 12.8 oz (86.1 kg), SpO2 94 %. Wt Readings from Last 3 Encounters:  02/22/21 189 lb 12.8 oz (86.1 kg)  01/18/21 187 lb 4.8 oz (85 kg)  09/17/18 174 lb 2 oz (79 kg)   Body mass index is 33.36 kg/m. Performance status (ECOG): 1 - Symptomatic but completely ambulatory Physical Exam Constitutional:      Appearance: Normal appearance. She is not ill-appearing.  HENT:  Mouth/Throat:     Mouth: Mucous membranes are moist.     Pharynx: Oropharynx is clear. No oropharyngeal exudate or posterior oropharyngeal erythema.  Neck:     Thyroid: No thyroid mass or thyromegaly.  Cardiovascular:     Rate and Rhythm: Normal rate and regular rhythm.     Heart sounds: No murmur heard.   No friction rub. No gallop.  Pulmonary:     Effort: Pulmonary effort is normal. No respiratory distress.     Breath sounds: Normal breath sounds. No wheezing, rhonchi or rales.  Chest:  Breasts:    Right: No axillary adenopathy or supraclavicular adenopathy.     Left: No axillary adenopathy or supraclavicular adenopathy.  Abdominal:     General: Bowel sounds are normal. There is no distension.     Palpations: Abdomen is soft. There is no mass.     Tenderness: There is no abdominal tenderness.  Musculoskeletal:        General: No swelling.     Right lower leg: No edema.     Left lower leg: No edema.  Lymphadenopathy:     Cervical: No cervical adenopathy.     Upper Body:     Right upper body: No supraclavicular or axillary adenopathy.     Left upper body: No supraclavicular or axillary adenopathy.     Lower Body: No right inguinal adenopathy. No left inguinal adenopathy.   Skin:    General: Skin is warm.     Coloration: Skin is not jaundiced.     Findings: No lesion or rash.  Neurological:     General: No focal deficit present.     Mental Status: She is alert and oriented to person, place, and time. Mental status is at baseline.     Cranial Nerves: Cranial nerves are intact.  Psychiatric:        Mood and Affect: Mood normal.        Behavior: Behavior normal.        Thought Content: Thought content normal.    LABS:   CBC Latest Ref Rng & Units 01/18/2021 03/27/2018 03/26/2018  WBC - 6.5 8.4 7.6  Hemoglobin 12.0 - 16.0 13.0 11.6(L) 12.1  Hematocrit 36 - 46 39 36.7 38.4  Platelets 150 - 399 305 241 211   CMP Latest Ref Rng & Units 01/18/2021 03/27/2018 03/26/2018  Glucose 70 - 99 mg/dL - - 115(H)  BUN 4 - 21 14 - 7  Creatinine 0.5 - 1.1 0.7 0.69 0.68  Sodium 137 - 147 140 - 138  Potassium 3.4 - 5.3 4.0 - 3.8  Chloride 99 - 108 106 - 104  CO2 13 - 22 29(A) - 25  Calcium 8.7 - 10.7 8.9 - 8.8(L)  Total Protein 6.5 - 8.1 g/dL - - 7.1  Total Bilirubin 0.3 - 1.2 mg/dL - - 0.7  Alkaline Phos 25 - 125 102 - 139(H)  AST 13 - 35 37(A) - 63(H)  ALT 7 - 35 33 - 75(H)    ASSESSMENT & PLAN:  A 60 y.o. female who I was asked to consult upon for clinically appears to be stage II (T1 N1 M0) thyroid cancer.  Based upon the prominent size of her left supraclavicular lymph node and the mild hypermetabolic activity seen in her left thyroid lobe, I do believe this patient likely warrants a total thyroidectomy and left supraclavicular nodal dissection.  I also anticipate she will need radioactive iodine ablation after surgery to ensure there is not residual thyroid  tissue/cancer present.  I will have her see Dr Noberto Retort in the forthcoming weeks to have this surgery performed.  I will see her back in 4-6 weeks to review her thyroid surgical results and their implications.  The patient understands all the plans discussed today and is in agreement with them.  I do appreciate  Darrol Jump, NP,  for his new consult.   Diyana Starrett Macarthur Critchley, MD

## 2021-02-28 ENCOUNTER — Telehealth: Payer: Self-pay | Admitting: Oncology

## 2021-02-28 ENCOUNTER — Other Ambulatory Visit: Payer: Medicare Other

## 2021-02-28 ENCOUNTER — Other Ambulatory Visit: Payer: Self-pay | Admitting: Genetic Counselor

## 2021-02-28 DIAGNOSIS — C73 Malignant neoplasm of thyroid gland: Secondary | ICD-10-CM

## 2021-02-28 DIAGNOSIS — C569 Malignant neoplasm of unspecified ovary: Secondary | ICD-10-CM

## 2021-02-28 DIAGNOSIS — Z809 Family history of malignant neoplasm, unspecified: Secondary | ICD-10-CM

## 2021-02-28 NOTE — Telephone Encounter (Signed)
02/28/21 spoke with patient and scheduled bloodwork.

## 2021-02-28 NOTE — Telephone Encounter (Signed)
Per 6/20 los paient rescheduled lab appt

## 2021-03-01 ENCOUNTER — Inpatient Hospital Stay: Payer: Medicare Other | Admitting: Genetic Counselor

## 2021-03-01 ENCOUNTER — Inpatient Hospital Stay: Payer: Medicare Other

## 2021-04-05 ENCOUNTER — Other Ambulatory Visit: Payer: Medicare Other

## 2021-04-05 ENCOUNTER — Ambulatory Visit: Payer: Medicare Other | Admitting: Oncology

## 2021-04-27 ENCOUNTER — Telehealth: Payer: Self-pay | Admitting: Cardiology

## 2021-04-27 NOTE — Telephone Encounter (Signed)
Spoke to the patient just now and recommended to her that while she is waiting on her appointment with Dr. Agustin Cree that she see her PCP. She states that she will do so.    Encouraged patient to call back with any questions or concerns.

## 2021-04-27 NOTE — Telephone Encounter (Signed)
Pt c/o of Chest Pain: 1. Are you having CP right now? No  2. Are you experiencing any other symptoms (ex. SOB, nausea, vomiting, sweating)? SOB 3. How long have you been experiencing CP? A few weeks 4. Is your CP continuous or coming and going? coming and going 5. Have you taken Nitroglycerin? No     Patient was last seen 2020

## 2021-04-28 ENCOUNTER — Inpatient Hospital Stay: Payer: Medicare Other | Attending: Hematology and Oncology

## 2021-04-28 ENCOUNTER — Inpatient Hospital Stay: Payer: Medicare Other | Admitting: Oncology

## 2021-05-25 DIAGNOSIS — J42 Unspecified chronic bronchitis: Secondary | ICD-10-CM | POA: Insufficient documentation

## 2021-05-25 DIAGNOSIS — F419 Anxiety disorder, unspecified: Secondary | ICD-10-CM | POA: Insufficient documentation

## 2021-05-25 DIAGNOSIS — J189 Pneumonia, unspecified organism: Secondary | ICD-10-CM | POA: Insufficient documentation

## 2021-05-25 DIAGNOSIS — M545 Low back pain, unspecified: Secondary | ICD-10-CM | POA: Insufficient documentation

## 2021-05-25 DIAGNOSIS — G8929 Other chronic pain: Secondary | ICD-10-CM | POA: Insufficient documentation

## 2021-05-25 DIAGNOSIS — R519 Headache, unspecified: Secondary | ICD-10-CM | POA: Insufficient documentation

## 2021-06-21 ENCOUNTER — Ambulatory Visit: Payer: Medicare Other | Admitting: Cardiology

## 2021-06-21 NOTE — Progress Notes (Deleted)
Cardiology Office Note:    Date:  06/21/2021   ID:  Melissa Conley, DOB 1960/09/19, MRN [id number]  PCP:  Darrol Jump, NP  Cardiologist:  Shirlee More, MD    Referring MD: Darrol Jump, NP    ASSESSMENT:    No diagnosis found. PLAN:    In order of problems listed above:  ***   Next appointment: ***   Medication Adjustments/Labs and Tests Ordered: Current medicines are reviewed at length with the patient today.  Concerns regarding medicines are outlined above.  No orders of the defined types were placed in this encounter.  No orders of the defined types were placed in this encounter.   No chief complaint on file.   History of Present Illness:    Melissa Conley is a 60 y.o. female with a hx of COPD chronic pain syndrome and angina with normal coronary arteriography in 2010 last seen virtual visits 12/24/2018 during COVID-19 pandemic.  Cardiac CTA performed 10/29/2018 with a calcium score of 19.6 and no obstructive CAD. Compliance with diet, lifestyle and medications:  Past Medical History:  Diagnosis Date   Acid reflux    Angina at rest [facility])    Anxiety    hx (03/26/2018)   Bulging lumbar disc 03/28/2016   Overview:  L4-5, left   Chronic bronchitis (HCC)    Chronic lower back pain    Chronic pain syndrome 11/02/2015   Common migraine    "monthly" (03/26/2018)   COPD (chronic obstructive pulmonary disease) ([place]) 01/28/2018   Dysuria 01/31/2017   Fall from high place 03/25/2018   "deck; fell ~ 76ft; broke both feet"   Frequent headaches    "qod" (03/26/2018)   GERD without esophagitis 01/28/2018   History of abnormal mammogram 12/20/2016   IBS (irritable bowel syndrome)    Lumbar radiculopathy 11/02/2015   Menopausal disorder 01/05/2016   Neck pain 11/02/2015   Numbness and tingling of both legs below knees 02/10/2016   Ovarian cancer ([place])    Pneumonia    "twice" (03/26/2018)   Postmenopausal HRT (hormone replacement therapy) 01/31/2017   Spondylosis of lumbar  region without myelopathy or radiculopathy 04/19/2016   Thoracic back pain 11/02/2015   Unstable angina ([place]) 01/28/2018    Past Surgical History:  Procedure Laterality Date   BACK SURGERY     CARDIAC CATHETERIZATION     CHOLECYSTECTOMY OPEN     DILATION AND CURETTAGE OF UTERUS     ORIF ANKLE FRACTURE Right 03/27/2018   Procedure: OPEN REDUCTION INTERNAL FIXATION (ORIF) ANKLE FRACTURE;  Surgeon: Dorna Leitz, MD;  Location: [place] [facility];  Service: Orthopedics;  Laterality: Right;   POSTERIOR LUMBAR FUSION  2000   TUBAL LIGATION     VAGINAL HYSTERECTOMY      Current Medications: No outpatient medications have been marked as taking for the 06/21/21 encounter (Appointment) with Richardo Priest, MD.     Allergies:   Fluzone [influenza virus vaccine], Penicillins, Gabapentin, Tapentadol, Methocarbamol, Morphine, and Tizanidine   Social History   Socioeconomic History   Marital status: Widowed    Spouse name: Not on file   Number of children: Not on file   Years of education: Not on file   Highest education level: 10th grade  Occupational History   Not on file  Tobacco Use   Smoking status: Former    Packs/day: 1.50    Years: 30.00    Pack years: 45.00    Types: Cigarettes    Quit date: 2010  Years since quitting: 12.7   Smokeless tobacco: Never  Vaping Use   Vaping Use: Never used  Substance and Sexual Activity   Alcohol use: Not Currently   Drug use: Never   Sexual activity: Not Currently  Other Topics Concern   Not on file  Social History Narrative   Not on file   Social Determinants of Health   Financial Resource Strain: Not on file  Food Insecurity: Not on file  Transportation Needs: Not on file  Physical Activity: Not on file  Stress: Not on file  Social Connections: Not on file     Family History: The patient's ***family history includes Bone cancer in her brother; Brain cancer in her sister; Heart attack in her mother; Lung cancer in her brother and mother;  Lymphoma in her mother; Ovarian cancer in her sister. ROS:   Please see the history of present illness.    All other systems reviewed and are negative.  EKGs/Labs/Other Studies Reviewed:    The following studies were reviewed today:  EKG:  EKG ordered today and personally reviewed.  The ekg ordered today demonstrates ***  Recent Labs: 01/18/2021: ALT 33; BUN 14; Creatinine 0.7; Hemoglobin 13.0; Platelets 305; Potassium 4.0; Sodium 140  Recent Lipid Panel    Component Value Date/Time   CHOL 198 02/06/2018 1110   TRIG 167 (H) 02/06/2018 1110   HDL 62 02/06/2018 1110   CHOLHDL 3.2 02/06/2018 1110   LDLCALC 103 (H) 02/06/2018 1110    Physical Exam:    VS:  There were no vitals taken for this visit.    Wt Readings from Last 3 Encounters:  02/22/21 189 lb 12.8 oz (86.1 kg)  01/18/21 187 lb 4.8 oz (85 kg)  09/17/18 174 lb 2 oz (79 kg)     GEN: *** Well nourished, well developed in no acute distress HEENT: Normal NECK: No JVD; No carotid bruits LYMPHATICS: No lymphadenopathy CARDIAC: ***RRR, no murmurs, rubs, gallops RESPIRATORY:  Clear to auscultation without rales, wheezing or rhonchi  ABDOMEN: Soft, non-tender, non-distended MUSCULOSKELETAL:  No edema; No deformity  SKIN: Warm and dry NEUROLOGIC:  Alert and oriented x 3 PSYCHIATRIC:  Normal affect    Signed, Shirlee More, MD  06/21/2021 7:44 AM    [facility]

## 2021-07-19 NOTE — Progress Notes (Signed)
Cardiology Office Note:    Date:  07/20/2021   ID:  [name], DOB 12/05/1960, MRN [id number]  PCP:  Darrol Jump, NP  Cardiologist:  Shirlee More, MD    Referring MD: Darrol Jump, NP    ASSESSMENT:    1. Chest pain, unspecified type   2. Agatston coronary artery calcium score less than 100   3. Chronic bronchitis, unspecified chronic bronchitis type ([place])    PLAN:    In order of problems listed above:  Concerning differential diagnosis coronary ischemia versus venous thromboembolism.  Showed a chest x-ray a D-dimer that is elevated she will have a CTA for pulmonary embolism and cannot she be set up for a facilitated cardiac CTA for coronary artery disease.  Continue aspirin and start a statin.  I am not going to give her any maintenance antianginal medications with chronic borderline blood pressure.   Next appointment: 4 weeks   Medication Adjustments/Labs and Tests Ordered: Current medicines are reviewed at length with the patient today.  Concerns regarding medicines are outlined above.  Orders Placed This Encounter  Procedures   EKG 12-Lead   No orders of the defined types were placed in this encounter.   Chief Complaint  Patient presents with   Follow-up    After cardiac CTA    History of Present Illness:    Melissa Conley is a 59 y.o. female with a hx of COPD chronic pain syndrome and chest pain with normal coronary arteriography in 2010 last seen 09/17/2018 with chest pain and referred for cardiac CTA.  Compliance with diet, lifestyle and medications: Yes  Cardiac CTA reported 10/29/2018 showed a calcium score of 19.6-86 percentile for age and sex she had no stenotic lesion noted. Recent labs [facility] 05/31/2021: Hemoglobin 11.9 Potassium 4.2 creatinine 0.69 GFR greater than 90 cc This is a very complicated visit. I have not received any formal communication which she tells me that her oncologist advised her to be seen by me for several  reasons For she has a history of chronic low blood pressure and tells me she has had some systolics in the range of 44mmHg Secondary she is short of breath with and without activity since worse Third she has had multiple episodes sometimes 1 to 2-week of exertional pressure in the chest radiates to left arm and relieved with rest She has on aspirin but is not taking a statin with her elevated coronary calcium score percentile  My concerns involve venous thromboembolism with her underlying cancer being treated as well as progressive coronary artery disease. She is not having edema orthopnea palpitation or syncope Past Medical History:  Diagnosis Date   Acid reflux    Angina at rest [facility])    Anxiety    hx (03/26/2018)   Bulging lumbar disc 03/28/2016   Overview:  L4-5, left   Chronic bronchitis (HCC)    Chronic lower back pain    Chronic pain syndrome 11/02/2015   Common migraine    "monthly" (03/26/2018)   COPD (chronic obstructive pulmonary disease) ([place]) 01/28/2018   Dysuria 01/31/2017   Fall from high place 03/25/2018   "deck; fell ~ 87ft; broke both feet"   Frequent headaches    "qod" (03/26/2018)   GERD without esophagitis 01/28/2018   History of abnormal mammogram 12/20/2016   IBS (irritable bowel syndrome)    Lumbar radiculopathy 11/02/2015   Menopausal disorder 01/05/2016   Neck pain 11/02/2015   Numbness and tingling of both legs below knees  02/10/2016   Ovarian cancer ([place])    Pneumonia    "twice" (03/26/2018)   Postmenopausal HRT (hormone replacement therapy) 01/31/2017   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/19/2016   Thoracic back pain 11/02/2015   Unstable angina ([place]) 01/28/2018    Past Surgical History:  Procedure Laterality Date   BACK SURGERY     CARDIAC CATHETERIZATION     CHOLECYSTECTOMY OPEN     DILATION AND CURETTAGE OF UTERUS     ORIF ANKLE FRACTURE Right 03/27/2018   Procedure: OPEN REDUCTION INTERNAL FIXATION (ORIF) ANKLE FRACTURE;   Surgeon: Dorna Leitz, MD;  Location: [place];  Service: Orthopedics;  Laterality: Right;   POSTERIOR LUMBAR FUSION  2000   TUBAL LIGATION     VAGINAL HYSTERECTOMY      Current Medications: Current Meds  Medication Sig   albuterol (VENTOLIN HFA) 108 (90 Base) MCG/ACT inhaler Inhale 1 puff into the lungs 4 (four) times daily as needed for shortness of breath.    aspirin 81 MG EC tablet Take 81 mg by mouth daily.   fluticasone (FLONASE) 50 MCG/ACT nasal spray Place into the nose as needed.   levothyroxine (SYNTHROID) 137 MCG tablet Take 137 mcg by mouth daily.   Multiple Vitamin (MULTIVITAMIN) tablet Take 1 tablet by mouth daily.   naloxone (NARCAN) nasal spray 4 mg/0.1 mL Place 1 spray into the nose as needed for opioid reversal.   oxyCODONE (ROXICODONE) 15 MG immediate release tablet Take 15 mg by mouth 4 (four) times daily. Patient takes it four times a day scheduled and not as needed   polyethylene glycol (MIRALAX / GLYCOLAX) 17 g packet Take 17 g by mouth daily.     Allergies:   Fluzone [influenza virus vaccine], Hemophilus b polysaccharide vaccine, Penicillins, Gabapentin, Tapentadol, Methocarbamol, Morphine, and Tizanidine   Social History   Socioeconomic History   Marital status: Widowed    Spouse name: Not on file   Number of children: Not on file   Years of education: Not on file   Highest education level: 10th grade  Occupational History   Not on file  Tobacco Use   Smoking status: Former    Packs/day: 1.50    Years: 30.00    Pack years: 45.00    Types: Cigarettes    Quit date: 2010    Years since quitting: 12.8   Smokeless tobacco: Never  Vaping Use   Vaping Use: Never used  Substance and Sexual Activity   Alcohol use: Not Currently   Drug use: Never   Sexual activity: Not Currently  Other Topics Concern   Not on file  Social History Narrative   Not on file   Social Determinants of Health   Financial Resource Strain: Not on file  Food Insecurity: Not on  file  Transportation Needs: Not on file  Physical Activity: Not on file  Stress: Not on file  Social Connections: Not on file     Family History: The patient's family history includes Bone cancer in her brother; Brain cancer in her sister; Heart attack in her mother; Lung cancer in her brother and mother; Lymphoma in her mother; Ovarian cancer in her sister. ROS:   Please see the history of present illness.    All other systems reviewed and are negative.  EKGs/Labs/Other Studies Reviewed:    The following studies were reviewed today:  EKG:  EKG ordered today and personally reviewed.  The ekg ordered today demonstrates sinus rhythm and is normal  Recent Labs: 01/18/2021: ALT 33; BUN 14; Creatinine 0.7; Hemoglobin 13.0; Platelets 305; Potassium 4.0; Sodium 140  Recent Lipid Panel    Component Value Date/Time   CHOL 198 02/06/2018 1110   TRIG 167 (H) 02/06/2018 1110   HDL 62 02/06/2018 1110   CHOLHDL 3.2 02/06/2018 1110   LDLCALC 103 (H) 02/06/2018 1110    Physical Exam:    VS:  BP (!) 84/60 (BP Location: Right Arm, Patient Position: Sitting, Cuff Size: Normal)   Pulse 67   Ht 5' 3.25" (1.607 m)   Wt 186 lb (84.4 kg)   SpO2 96%   BMI 32.69 kg/m     Wt Readings from Last 3 Encounters:  07/20/21 186 lb (84.4 kg)  02/22/21 189 lb 12.8 oz (86.1 kg)  01/18/21 187 lb 4.8 oz (85 kg)    Repeat blood pressure by me right upper extremity 96/70 GEN:  Well nourished, well developed in no acute distress HEENT: Normal NECK: No JVD; No carotid bruits LYMPHATICS: No lymphadenopathy CARDIAC: RRR, no murmurs, rubs, gallops RESPIRATORY:  Clear to auscultation without rales, wheezing or rhonchi  ABDOMEN: Soft, non-tender, non-distended MUSCULOSKELETAL:  No edema; No deformity  SKIN: Warm and dry NEUROLOGIC:  Alert and oriented x 3 PSYCHIATRIC:  Normal affect    Signed, Shirlee More, MD  07/20/2021 9:11 AM    [place]

## 2021-07-20 ENCOUNTER — Encounter: Payer: Self-pay | Admitting: Cardiology

## 2021-07-20 ENCOUNTER — Other Ambulatory Visit: Payer: Self-pay

## 2021-07-20 ENCOUNTER — Ambulatory Visit (HOSPITAL_BASED_OUTPATIENT_CLINIC_OR_DEPARTMENT_OTHER)
Admission: RE | Admit: 2021-07-20 | Discharge: 2021-07-20 | Disposition: A | Discharge status: Home / Self Care | Payer: Medicare Other | Source: Ambulatory Visit | Attending: Cardiology | Admitting: Cardiology

## 2021-07-20 ENCOUNTER — Ambulatory Visit (INDEPENDENT_AMBULATORY_CARE_PROVIDER_SITE_OTHER): Payer: Medicare Other | Admitting: Cardiology

## 2021-07-20 VITALS — BP 84/60 | HR 67 | Ht 63.25 in | Wt 186.0 lb

## 2021-07-20 DIAGNOSIS — J42 Unspecified chronic bronchitis: Secondary | ICD-10-CM

## 2021-07-20 DIAGNOSIS — R931 Abnormal findings on diagnostic imaging of heart and coronary circulation: Secondary | ICD-10-CM | POA: Diagnosis not present

## 2021-07-20 DIAGNOSIS — R9430 Abnormal result of cardiovascular function study, unspecified: Secondary | ICD-10-CM

## 2021-07-20 DIAGNOSIS — R079 Chest pain, unspecified: Secondary | ICD-10-CM

## 2021-07-20 MED ORDER — ROSUVASTATIN CALCIUM 20 MG PO TABS: 20.0000 mg | ORAL_TABLET | Freq: Every day | ORAL | 3 refills | Status: DC

## 2021-07-20 NOTE — Patient Instructions (Signed)
Medication Instructions:  Your physician has recommended you make the following change in your medication:  START: Rosuvastatin 20 mg take one tablet by mouth daily.  *If you need a refill on your cardiac medications before your next appointment, please call your pharmacy*   Lab Work: Your physician recommends that you return for lab work in: TODAY BMP, D-DIMER, PROBNP, TROPONIN If you have labs (blood work) drawn today and your tests are completely normal, you will receive your results only by: MyChart Message (if you have MyChart) OR A paper copy in the mail If you have any lab test that is abnormal or we need to change your treatment, we will call you to review the results.   Testing/Procedures: We have placed the order for you to have a chest x-ray completed. Please go downstairs to get this done today.     Your cardiac CT will be scheduled at the below location:   [facility] [address] [place], [place] [address] [phone]  If scheduled at [facility], please arrive at the [facility] main entrance (entrance A) of [facility] 30 minutes prior to test start time. You can use the FREE valet parking offered at the main entrance (encouraged to control the heart rate for the test) Proceed to the [facility] Radiology Department (first floor) to check-in and test prep.  Please follow these instructions carefully (unless otherwise directed):  On the Night Before the Test: Be sure to Drink plenty of water. Do not consume any caffeinated/decaffeinated beverages or chocolate 12 hours prior to your test. Do not take any antihistamines 12 hours prior to your test.  On the Day of the Test: Drink plenty of water until 1 hour prior to the test. Do not eat any food 4 hours prior to the test. You may take your regular medications prior to the test.  FEMALES- please wear underwire-free bra if available, avoid dresses & tight clothing  After the  Test: Drink plenty of water. After receiving IV contrast, you may experience a mild flushed feeling. This is normal. On occasion, you may experience a mild rash up to 24 hours after the test. This is not dangerous. If this occurs, you can take Benadryl 25 mg and increase your fluid intake. If you experience trouble breathing, this can be serious. If it is severe call 911 IMMEDIATELY. If it is mild, please call our office. If you take any of these medications: Glipizide/Metformin, Avandament, Glucavance, please do not take 48 hours after completing test unless otherwise instructed.  Please allow 2-4 weeks for scheduling of routine cardiac CTs. Some insurance companies require a pre-authorization which may delay scheduling of this test.   For non-scheduling related questions, please contact the cardiac imaging nurse navigator should you have any questions/concerns: Marchia Bond, Cardiac Imaging Nurse Navigator Gordy Clement, Cardiac Imaging Nurse Navigator [facility] Direct Office Dial: [phone]   For scheduling needs, including cancellations and rescheduling, please call [place], [phone].    Follow-Up: At [facility], you and your health needs are our priority.  As part of our continuing mission to provide you with exceptional heart care, we have created designated Provider Care Teams.  These Care Teams include your primary Cardiologist (physician) and Advanced Practice Providers (APPs -  Physician Assistants and Nurse Practitioners) who all work together to provide you with the care you need, when you need it.  We recommend signing up for the patient portal called "MyChart".  Sign up information is provided on this After Visit Summary.  MyChart is used to connect with patients for Virtual Visits (Telemedicine).  Patients are able to view lab/test results, encounter notes, upcoming appointments, etc.  Non-urgent messages can be sent to your provider as  well.   To learn more about what you can do with MyChart, go to [email or web address].    Your next appointment:   4 week(s)  The format for your next appointment:   In Person  Provider:   Any provider     Other Instructions

## 2021-07-21 ENCOUNTER — Telehealth: Payer: Self-pay

## 2021-07-21 DIAGNOSIS — R0602 Shortness of breath: Secondary | ICD-10-CM

## 2021-07-21 DIAGNOSIS — R072 Precordial pain: Secondary | ICD-10-CM

## 2021-07-21 LAB — BASIC METABOLIC PANEL
BUN/Creatinine Ratio: 18 (ref 9–23)
BUN: 10 mg/dL (ref 6–24)
CO2: 23 mmol/L (ref 20–29)
Calcium: 8.9 mg/dL (ref 8.7–10.2)
Chloride: 103 mmol/L (ref 96–106)
Creatinine, Ser: 0.56 mg/dL — ABNORMAL LOW (ref 0.57–1.00)
Glucose: 113 mg/dL — ABNORMAL HIGH (ref 70–99)
Potassium: 4.4 mmol/L (ref 3.5–5.2)
Sodium: 139 mmol/L (ref 134–144)
eGFR: 106 mL/min/{1.73_m2} (ref >59)

## 2021-07-21 LAB — LIPID PANEL
Chol/HDL Ratio: 2.8 ratio (ref 0.0–4.4)
Cholesterol, Total: 199 mg/dL (ref 100–199)
HDL: 70 mg/dL (ref >39)
LDL Chol Calc (NIH): 106 mg/dL — ABNORMAL HIGH (ref 0–99)
Triglycerides: 131 mg/dL (ref 0–149)
VLDL Cholesterol Cal: 23 mg/dL (ref 5–40)

## 2021-07-21 LAB — D-DIMER, QUANTITATIVE: D-DIMER: 2.34 mg/L FEU — ABNORMAL HIGH (ref 0.00–0.49)

## 2021-07-21 LAB — PRO B NATRIURETIC PEPTIDE: NT-Pro BNP: 101 pg/mL (ref 0–287)

## 2021-07-21 LAB — TROPONIN T: Troponin T (Highly Sensitive): <6 ng/L (ref 0–14)

## 2021-07-21 NOTE — Telephone Encounter (Signed)
Spoke with patient regarding results and recommendation.  Patient verbalizes understanding and is agreeable to plan of care. Advised patient to call back with any issues or concerns.    Call placed to the CT department in [facility] to see if we could get this done tomorrow as the patient states she is absolutely unavailable today. I left a voicemail for them to call me back as no one answered in the imaging department.

## 2021-07-21 NOTE — Telephone Encounter (Signed)
-----   Message from Richardo Priest, MD sent at 07/21/2021  9:24 AM EST ----- Her D-dimer is very elevated she is at risk of pulmonary embolism with her cancer undergoing active treatment I would like her to have a chest CT pulmonary embolism protocol done as quickly as possible.

## 2021-07-22 ENCOUNTER — Telehealth: Payer: Self-pay | Admitting: Cardiology

## 2021-07-22 NOTE — Telephone Encounter (Signed)
Pt advised report was negative for PE but Dr. Bettina Gavia would review for further recommendation.

## 2021-07-22 NOTE — Telephone Encounter (Signed)
  Pt is calling following up CT, it was done in [facility], he was told that by the hospital she will get a call from Dr. Bettina Gavia to give result in an hour, she is hoping to hear from him today

## 2021-07-25 NOTE — Telephone Encounter (Signed)
Spoke to the patient just now and let her know that Dr. Bettina Gavia reviewed this CT report and advised that it was normal. He would like for her to have the cardiac CT still at this time. The patient verbalizes understanding and thanked me for the call back.    Encouraged patient to call back with any questions or concerns.

## 2021-08-12 ENCOUNTER — Telehealth (HOSPITAL_COMMUNITY): Payer: Self-pay | Admitting: *Deleted

## 2021-08-12 NOTE — Telephone Encounter (Signed)
Reaching out to patient to offer assistance regarding upcoming cardiac imaging study; pt verbalizes understanding of appt date/time, parking situation and where to check in, pre-test NPO status  and verified current allergies; name and call back number provided for further questions should they arise ? ?Donn Wilmot RN Navigator Cardiac Imaging ?[facility] ?[phone] office ?[phone] cell ? ?

## 2021-08-16 ENCOUNTER — Ambulatory Visit (HOSPITAL_COMMUNITY)
Admission: RE | Admit: 2021-08-16 | Discharge: 2021-08-16 | Disposition: A | Discharge status: Home / Self Care | Payer: Medicare Other | Source: Ambulatory Visit | Attending: Cardiology | Admitting: Cardiology

## 2021-08-16 ENCOUNTER — Ambulatory Visit: Payer: Medicare Other | Admitting: Cardiology

## 2021-08-18 ENCOUNTER — Ambulatory Visit: Payer: Medicare Other | Admitting: Cardiology

## 2021-08-23 NOTE — Progress Notes (Deleted)
Cardiology Office Note:    Date:  08/23/2021   ID:  Melissa Conley, DOB December 15, 1960, MRN [id number]  PCP:  Darrol Jump, NP  Cardiologist:  Shirlee More, MD    Referring MD: Darrol Jump, NP    ASSESSMENT:    No diagnosis found. PLAN:    In order of problems listed above:  ***   Next appointment: ***   Medication Adjustments/Labs and Tests Ordered: Current medicines are reviewed at length with the patient today.  Concerns regarding medicines are outlined above.  No orders of the defined types were placed in this encounter.  No orders of the defined types were placed in this encounter.   No chief complaint on file.  .[facility]  History of Present Illness:    Melissa Conley is a 60 y.o. female with a hx of COPD chest pain normal coronary arteriography 2010 and a cardiac CTA 2020 with a calcium score of 19.6/86 percentile and no stenotic lesion.  She was last seen 07/20/2021 at the advice of her oncologist with labile blood pressure shortness of breath and exertional chest pain.  D-dimer was significantly elevated advised CTA pulmonary embolism protocol performed at [facility] she had no findings of pulmonary embolism thoracic aorta was normal in size tortuous no pericardial effusion and focal mild calcification of the left anterior descending showed calcific granulomas noted left chest 07/22/2021. Compliance with diet, lifestyle and medications: *** Past Medical History:  Diagnosis Date   Acid reflux    Angina at rest [facility])    Anxiety    hx (03/26/2018)   Bulging lumbar disc 03/28/2016   Overview:  L4-5, left   Chronic bronchitis (HCC)    Chronic lower back pain    Chronic pain syndrome 11/02/2015   Common migraine    "monthly" (03/26/2018)   COPD (chronic obstructive pulmonary disease) ([place]) 01/28/2018   Dysuria 01/31/2017   Fall from high place 03/25/2018   "deck; fell ~ 36ft; broke both feet"   Frequent headaches    "qod" (03/26/2018)   GERD without  esophagitis 01/28/2018   History of abnormal mammogram 12/20/2016   IBS (irritable bowel syndrome)    Lumbar radiculopathy 11/02/2015   Menopausal disorder 01/05/2016   Neck pain 11/02/2015   Numbness and tingling of both legs below knees 02/10/2016   Ovarian cancer ([place])    Pneumonia    "twice" (03/26/2018)   Postmenopausal HRT (hormone replacement therapy) 01/31/2017   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/19/2016   Thoracic back pain 11/02/2015   Unstable angina ([place]) 01/28/2018    Past Surgical History:  Procedure Laterality Date   BACK SURGERY     CARDIAC CATHETERIZATION     CHOLECYSTECTOMY OPEN     DILATION AND CURETTAGE OF UTERUS     ORIF ANKLE FRACTURE Right 03/27/2018   Procedure: OPEN REDUCTION INTERNAL FIXATION (ORIF) ANKLE FRACTURE;  Surgeon: Dorna Leitz, MD;  Location: [place];  Service: Orthopedics;  Laterality: Right;   POSTERIOR LUMBAR FUSION  2000   TUBAL LIGATION     VAGINAL HYSTERECTOMY      Current Medications: No outpatient medications have been marked as taking for the 08/24/21 encounter (Appointment) with Richardo Priest, MD.     Allergies:   Fluzone [influenza virus vaccine], Hemophilus b polysaccharide vaccine, Penicillins, Gabapentin, Tapentadol, Methocarbamol, Morphine, and Tizanidine   Social History   Socioeconomic History   Marital status: Widowed    Spouse name: Not on file   Number of children: Not  on file   Years of education: Not on file   Highest education level: 10th grade  Occupational History   Not on file  Tobacco Use   Smoking status: Former    Packs/day: 1.50    Years: 30.00    Pack years: 45.00    Types: Cigarettes    Quit date: 2010    Years since quitting: 12.9   Smokeless tobacco: Never  Vaping Use   Vaping Use: Never used  Substance and Sexual Activity   Alcohol use: Not Currently   Drug use: Never   Sexual activity: Not Currently  Other Topics Concern   Not on file  Social History Narrative    Not on file   Social Determinants of Health   Financial Resource Strain: Not on file  Food Insecurity: Not on file  Transportation Needs: Not on file  Physical Activity: Not on file  Stress: Not on file  Social Connections: Not on file     Family History: The patient's ***family history includes Bone cancer in her brother; Brain cancer in her sister; Heart attack in her mother; Lung cancer in her brother and mother; Lymphoma in her mother; Ovarian cancer in her sister. ROS:   Please see the history of present illness.    All other systems reviewed and are negative.  EKGs/Labs/Other Studies Reviewed:    The following studies were reviewed today:  EKG:  EKG ordered today and personally reviewed.  The ekg ordered today demonstrates ***  Recent Labs: 01/18/2021: ALT 33; Hemoglobin 13.0; Platelets 305 07/20/2021: BUN 10; Creatinine, Ser 0.56; NT-Pro BNP 101; Potassium 4.4; Sodium 139  Recent Lipid Panel    Component Value Date/Time   CHOL 199 07/20/2021 0925   TRIG 131 07/20/2021 0925   HDL 70 07/20/2021 0925   CHOLHDL 2.8 07/20/2021 0925   LDLCALC 106 (H) 07/20/2021 0925    Physical Exam:    VS:  There were no vitals taken for this visit.    Wt Readings from Last 3 Encounters:  07/20/21 186 lb (84.4 kg)  02/22/21 189 lb 12.8 oz (86.1 kg)  01/18/21 187 lb 4.8 oz (85 kg)     GEN: *** Well nourished, well developed in no acute distress HEENT: Normal NECK: No JVD; No carotid bruits LYMPHATICS: No lymphadenopathy CARDIAC: ***RRR, no murmurs, rubs, gallops RESPIRATORY:  Clear to auscultation without rales, wheezing or rhonchi  ABDOMEN: Soft, non-tender, non-distended MUSCULOSKELETAL:  No edema; No deformity  SKIN: Warm and dry NEUROLOGIC:  Alert and oriented x 3 PSYCHIATRIC:  Normal affect    Signed, Shirlee More, MD  08/23/2021 1:00 PM    [place]

## 2021-08-24 ENCOUNTER — Ambulatory Visit: Payer: Medicare Other | Admitting: Cardiology

## 2021-08-26 ENCOUNTER — Telehealth (HOSPITAL_COMMUNITY): Payer: Self-pay | Admitting: *Deleted

## 2021-08-26 NOTE — Telephone Encounter (Signed)
Attempted to call patient regarding upcoming cardiac CT appointment. °Left message on voicemail with name and callback number ° °Ramina Hulet RN Navigator Cardiac Imaging °[facility] °[phone] Office °[phone] Cell ° °

## 2021-08-29 ENCOUNTER — Other Ambulatory Visit: Payer: Self-pay

## 2021-08-29 ENCOUNTER — Telehealth: Payer: Self-pay | Admitting: Cardiology

## 2021-08-29 ENCOUNTER — Ambulatory Visit (HOSPITAL_COMMUNITY)
Admission: RE | Admit: 2021-08-29 | Discharge: 2021-08-29 | Disposition: A | Discharge status: Home / Self Care | Payer: Medicare Other | Source: Ambulatory Visit | Attending: Cardiology | Admitting: Cardiology

## 2021-08-29 DIAGNOSIS — R079 Chest pain, unspecified: Secondary | ICD-10-CM | POA: Insufficient documentation

## 2021-08-29 DIAGNOSIS — R931 Abnormal findings on diagnostic imaging of heart and coronary circulation: Secondary | ICD-10-CM | POA: Insufficient documentation

## 2021-08-29 DIAGNOSIS — R9430 Abnormal result of cardiovascular function study, unspecified: Secondary | ICD-10-CM | POA: Insufficient documentation

## 2021-08-29 MED ORDER — NITROGLYCERIN 0.4 MG SL SUBL
SUBLINGUAL_TABLET | SUBLINGUAL | Status: AC
  Filled 2021-08-29: qty 2

## 2021-08-29 MED ORDER — NITROGLYCERIN 0.4 MG SL SUBL
0.8000 mg | SUBLINGUAL_TABLET | Freq: Once | SUBLINGUAL | Status: AC
Start: 2021-08-29 — End: 2021-08-29
  Administered 2021-08-29: 14:00:00 0.8 mg via SUBLINGUAL

## 2021-08-29 MED ORDER — IOHEXOL 350 MG/ML SOLN
100.0000 mL | Freq: Once | INTRAVENOUS | Status: AC | PRN
  Administered 2021-08-29: 15:00:00 100 mL via INTRAVENOUS

## 2021-08-29 NOTE — Telephone Encounter (Signed)
Ruby with [facility] Radiology calling with a call report for the patient's CT.

## 2021-08-29 NOTE — Telephone Encounter (Signed)
Spoke to [place] just now and he was calling to report that on the patients CT scan they found   Subtle right middle lobe airspace disease, new since 07/22/2021. Suspicious for pneumonia.

## 2021-08-30 NOTE — Telephone Encounter (Signed)
Spoke with patient regarding results and recommendation.  Patient verbalizes understanding and is agreeable to plan of care. Advised patient to call back with any issues or concerns.  

## 2021-09-11 DIAGNOSIS — C884 Extranodal marginal zone b-cell lymphoma of mucosa-associated lymphoid tissue (malt-lymphoma) not having achieved remission: Secondary | ICD-10-CM

## 2021-09-11 HISTORY — DX: Extranodal marginal zone B-cell lymphoma of mucosa-associated lymphoid tissue (MALT-lymphoma): C88.4

## 2021-09-11 HISTORY — DX: Extranodal marginal zone b-cell lymphoma of mucosa-associated lymphoid tissue (malt-lymphoma) not having achieved remission: C88.40

## 2021-09-13 ENCOUNTER — Ambulatory Visit: Payer: Medicare Other | Admitting: Cardiology

## 2021-12-21 ENCOUNTER — Encounter: Payer: Self-pay | Admitting: Gastroenterology

## 2022-01-10 ENCOUNTER — Other Ambulatory Visit (INDEPENDENT_AMBULATORY_CARE_PROVIDER_SITE_OTHER): Payer: Medicare Other

## 2022-01-10 ENCOUNTER — Encounter: Payer: Self-pay | Admitting: Gastroenterology

## 2022-01-10 ENCOUNTER — Ambulatory Visit (INDEPENDENT_AMBULATORY_CARE_PROVIDER_SITE_OTHER): Payer: Medicare Other | Admitting: Gastroenterology

## 2022-01-10 VITALS — BP 110/70 | HR 67 | Ht 65.0 in | Wt 176.0 lb

## 2022-01-10 DIAGNOSIS — R1011 Right upper quadrant pain: Secondary | ICD-10-CM

## 2022-01-10 DIAGNOSIS — K5900 Constipation, unspecified: Secondary | ICD-10-CM | POA: Diagnosis not present

## 2022-01-10 DIAGNOSIS — Z1211 Encounter for screening for malignant neoplasm of colon: Secondary | ICD-10-CM

## 2022-01-10 DIAGNOSIS — K760 Fatty (change of) liver, not elsewhere classified: Secondary | ICD-10-CM | POA: Diagnosis not present

## 2022-01-10 DIAGNOSIS — R0789 Other chest pain: Secondary | ICD-10-CM

## 2022-01-10 LAB — GAMMA GT: GGT: 76 U/L — ABNORMAL HIGH (ref 7–51)

## 2022-01-10 MED ORDER — NA SULFATE-K SULFATE-MG SULF 17.5-3.13-1.6 GM/177ML PO SOLN: 1.0000 | Freq: Once | ORAL | 0 refills | Status: AC
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
## 2022-01-10 NOTE — Patient Instructions (Signed)
If you are age 59 or older, your body mass index should be between 23-30. Your Body mass index is 29.29 kg/m?Marland Kitchen If this is out of the aforementioned range listed, please consider follow up with your Primary Care Provider. ? ?If you are age 9 or younger, your body mass index should be between 19-25. Your Body mass index is 29.29 kg/m?Marland Kitchen If this is out of the aformentioned range listed, please consider follow up with your Primary Care Provider.  ? ?You have been scheduled for an endoscopy and colonoscopy. Please follow the written instructions given to you at your visit today. ?Please pick up your prep supplies at the pharmacy within the next 1-3 days. ?If you use inhalers (even only as needed), please bring them with you on the day of your procedure.  ? ?Your provider has requested that you go to the basement level for lab work before leaving today. Press "B" on the elevator. The lab is located at the first door on the left as you exit the elevator. ? ?Your provider has requested that you have a chest x ray before leaving today. Please go to the basement floor to our Radiology department for the test. ? ?Increase Miralax to 2-3 times daily. ? ?The [place] [facility] providers would like to encourage you to use [facility] to communicate with providers for non-urgent requests or questions.  Due to long hold times on the telephone, sending your provider a message by [facility] may be a faster and more efficient way to get a response.  Please allow 48 business hours for a response.  Please remember that this is for non-urgent requests.  ? ?It was a pleasure to see you today! ? ?Thank you for trusting me with your gastrointestinal care!   ? ?Scott E.Candis Schatz, MD  ? ?

## 2022-01-13 ENCOUNTER — Encounter: Payer: Self-pay | Admitting: Gastroenterology

## 2022-01-16 LAB — HEPATITIS B SURFACE ANTIBODY,QUALITATIVE: Hep B S Ab: REACTIVE — AB

## 2022-01-16 LAB — TISSUE TRANSGLUTAMINASE, IGA: (tTG) Ab, IgA: <1 U/mL

## 2022-01-16 LAB — HEPATITIS C ANTIBODY
Hepatitis C Ab: NONREACTIVE
SIGNAL TO CUT-OFF: 0.07 (ref <1.00)

## 2022-01-16 LAB — ANTI-SMOOTH MUSCLE ANTIBODY, IGG: Actin (Smooth Muscle) Antibody (IGG): <20 U (ref <20)

## 2022-01-16 LAB — IGA: Immunoglobulin A: 84 mg/dL (ref 47–310)

## 2022-01-16 LAB — ALPHA-1-ANTITRYPSIN: A-1 Antitrypsin, Ser: 145 mg/dL (ref 83–199)

## 2022-01-16 LAB — HEPATITIS A ANTIBODY, TOTAL: Hepatitis A AB,Total: REACTIVE — AB

## 2022-01-16 LAB — MITOCHONDRIAL ANTIBODIES: Mitochondrial M2 Ab, IgG: <=20 U (ref <=20.0)

## 2022-01-16 LAB — HEPATITIS B SURFACE ANTIGEN: Hepatitis B Surface Ag: NONREACTIVE

## 2022-01-16 LAB — CERULOPLASMIN: Ceruloplasmin: 43 mg/dL (ref 18–53)

## 2022-01-18 NOTE — Progress Notes (Signed)
Melissa Conley, ?All the labs obtained to evaluate for causes of chronic liver disease were unremarkable.  You have antibodies to hepatitis A and hepatitis B indicating that you are immune, most likely through vaccination (these are standard vaccines and [place]).  Your hepatitis C antibody was negative.  Test for autoimmune hepatitis, Wilson disease and celiac disease were normal. ?Your gamma GT was slightly elevated, which is an indication of ongoing liver inflammation. ?As discussed in clinic, I suspect the most likely explanation for your fatty liver and elevated liver enzymes is nonalcoholic fatty liver disease, which is a disorder of the liver related to diet and exercise habits.  Again, I recommend making concerted dietary changes to include reducing consumption of processed foods, particularly sugar based foods.  Continue to avoid alcohol as you are doing. ?We can discuss this much more in detail in a follow-up clinic visit. ? ?We will see you later this month for your upper and lower endoscopy.

## 2022-02-08 ENCOUNTER — Encounter: Payer: Self-pay | Admitting: Gastroenterology

## 2022-02-08 ENCOUNTER — Ambulatory Visit (AMBULATORY_SURGERY_CENTER): Payer: Medicare Other | Admitting: Gastroenterology

## 2022-02-08 VITALS — BP 102/60 | HR 64 | Temp 98.0°F | Resp 16 | Ht 65.0 in | Wt 176.0 lb

## 2022-02-08 DIAGNOSIS — C884 Extranodal marginal zone B-cell lymphoma of mucosa-associated lymphoid tissue [MALT-lymphoma]: Secondary | ICD-10-CM | POA: Diagnosis not present

## 2022-02-08 DIAGNOSIS — D125 Benign neoplasm of sigmoid colon: Secondary | ICD-10-CM

## 2022-02-08 DIAGNOSIS — D123 Benign neoplasm of transverse colon: Secondary | ICD-10-CM | POA: Diagnosis not present

## 2022-02-08 DIAGNOSIS — D120 Benign neoplasm of cecum: Secondary | ICD-10-CM

## 2022-02-08 DIAGNOSIS — D124 Benign neoplasm of descending colon: Secondary | ICD-10-CM

## 2022-02-08 DIAGNOSIS — R1011 Right upper quadrant pain: Secondary | ICD-10-CM

## 2022-02-08 DIAGNOSIS — Z1211 Encounter for screening for malignant neoplasm of colon: Secondary | ICD-10-CM

## 2022-02-08 MED ORDER — SODIUM CHLORIDE 0.9 % IV SOLN: 500.0000 mL | Freq: Once | INTRAVENOUS | Status: DC

## 2022-02-08 NOTE — Patient Instructions (Signed)
Please read handouts provided. Continue present medications. Await pathology results.     YOU HAD AN ENDOSCOPIC PROCEDURE TODAY AT THE [facility]:   Refer to the procedure report that was given to you for any specific questions about what was found during the examination.  If the procedure report does not answer your questions, please call your gastroenterologist to clarify.  If you requested that your care partner not be given the details of your procedure findings, then the procedure report has been included in a sealed envelope for you to review at your convenience later.  YOU SHOULD EXPECT: Some feelings of bloating in the abdomen. Passage of more gas than usual.  Walking can help get rid of the air that was put into your GI tract during the procedure and reduce the bloating. If you had a lower endoscopy (such as a colonoscopy or flexible sigmoidoscopy) you may notice spotting of blood in your stool or on the toilet paper. If you underwent a bowel prep for your procedure, you may not have a normal bowel movement for a few days.  Please Note:  You might notice some irritation and congestion in your nose or some drainage.  This is from the oxygen used during your procedure.  There is no need for concern and it should clear up in a day or so.  SYMPTOMS TO REPORT IMMEDIATELY:   Following lower endoscopy (colonoscopy or flexible sigmoidoscopy):  Excessive amounts of blood in the stool  Significant tenderness or worsening of abdominal pains  Swelling of the abdomen that is new, acute  Fever of 100F or higher   Following upper endoscopy (EGD)  Vomiting of blood or coffee ground material  New chest pain or pain under the shoulder blades  Painful or persistently difficult swallowing  New shortness of breath  Fever of 100F or higher  Black, tarry-looking stools  For urgent or emergent issues, a gastroenterologist can be reached at any hour by calling [phone]. Do not  use MyChart messaging for urgent concerns.    DIET:  We do recommend a small meal at first, but then you may proceed to your regular diet.  Drink plenty of fluids but you should avoid alcoholic beverages for 24 hours.  ACTIVITY:  You should plan to take it easy for the rest of today and you should NOT DRIVE or use heavy machinery until tomorrow (because of the sedation medicines used during the test).    FOLLOW UP: Our staff will call the number listed on your records 48-72 hours following your procedure to check on you and address any questions or concerns that you may have regarding the information given to you following your procedure. If we do not reach you, we will leave a message.  We will attempt to reach you two times.  During this call, we will ask if you have developed any symptoms of COVID 19. If you develop any symptoms (ie: fever, flu-like symptoms, shortness of breath, cough etc.) before then, please call [phone].  If you test positive for Covid 19 in the 2 weeks post procedure, please call and report this information to us.    If any biopsies were taken you will be contacted by phone or by letter within the next 1-3 weeks.  Please call us at [phone] if you have not heard about the biopsies in 3 weeks.    SIGNATURES/CONFIDENTIALITY: You and/or your care partner have signed paperwork which will be entered into your electronic medical   record.  These signatures attest to the fact that that the information above on your After Visit Summary has been reviewed and is understood.  Full responsibility of the confidentiality of this discharge information lies with you and/or your care-partner. 

## 2022-02-08 NOTE — Op Note (Signed)
[place] Patient Name: Melissa Conley Procedure Date: 02/08/2022 3:00 PM MRN: [id number] Endoscopist: Nicki Reaper E. Candis Schatz , MD Age: 61 Referring MD:  Date of Birth: May 23, 1961 Gender: Female Account #: [id number] Procedure:                Colonoscopy Indications:              Screening for colorectal malignant neoplasm (last                            colonoscopy was 10 years ago) Medicines:                Monitored Anesthesia Care Procedure:                Pre-Anesthesia Assessment:                           - Prior to the procedure, a History and Physical                            was performed, and patient medications and                            allergies were reviewed. The patient's tolerance of                            previous anesthesia was also reviewed. The risks                            and benefits of the procedure and the sedation                            options and risks were discussed with the patient.                            All questions were answered, and informed consent                            was obtained. Prior Anticoagulants: The patient has                            taken no previous anticoagulant or antiplatelet                            agents except for aspirin. ASA Grade Assessment:                            III - A patient with severe systemic disease. After                            reviewing the risks and benefits, the patient was                            deemed in satisfactory condition to undergo the  procedure.                           After obtaining informed consent, the colonoscope                            was passed under direct vision. Throughout the                            procedure, the patient's blood pressure, pulse, and                            oxygen saturations were monitored continuously. The                            Olympus CF-HQ190L [id number]) Colonoscope was                             introduced through the anus and advanced to the the                            terminal ileum, with identification of the                            appendiceal orifice and IC valve. The colonoscopy                            was performed without difficulty. The patient                            tolerated the procedure well. The quality of the                            bowel preparation was good. Scope In: 3:25:41 PM Scope Out: 2:42:35 PM Scope Withdrawal Time: 0 hours 16 minutes 43 seconds  Total Procedure Duration: 0 hours 21 minutes 7 seconds  Findings:                 The perianal and digital rectal examinations were                            normal. Pertinent negatives include normal                            sphincter tone and no palpable rectal lesions.                           A 10 mm polyp was found in the cecum. The polyp was                            sessile. The polyp was removed with a cold snare.                            Resection and retrieval were complete. Estimated  blood loss was minimal.                           A 3 mm polyp was found in the transverse colon. The                            polyp was sessile. The polyp was removed with a                            cold snare. Resection and retrieval were complete.                            Estimated blood loss was minimal.                           A 3 mm polyp was found in the descending colon. The                            polyp was sessile. The polyp was removed with a                            cold snare. Resection and retrieval were complete.                            Estimated blood loss was minimal.                           Many flat polyps were found in the rectum,                            recto-sigmoid colon and sigmoid colon. The polyps                            were 1 to 4 mm in size. Two of these polyps were                            removed with  a cold snare. Resection and retrieval                            were complete. Estimated blood loss was minimal.                           The exam was otherwise normal throughout the                            examined colon.                           The terminal ileum appeared normal. Complications:            No immediate complications. Estimated Blood Loss:     Estimated blood loss was minimal. Impression:               - One 10 mm polyp in  the cecum, removed with a cold                            snare. Resected and retrieved.                           - One 3 mm polyp in the transverse colon, removed                            with a cold snare. Resected and retrieved.                           - One 3 mm polyp in the descending colon, removed                            with a cold snare. Resected and retrieved.                           - Many 1 to 4 mm polyps in the rectum, at the                            recto-sigmoid colon and in the sigmoid colon,                            removed with a cold snare. Resected and retrieved.                           - The examined portion of the ileum was normal. Recommendation:           - Patient has a contact number available for                            emergencies. The signs and symptoms of potential                            delayed complications were discussed with the                            patient. Return to normal activities tomorrow.                            Written discharge instructions were provided to the                            patient.                           - Resume previous diet.                           - Continue present medications.                           - Await pathology results.                           -  Repeat colonoscopy (date not yet determined) for                            surveillance based on pathology results. Yanely Mast E. Candis Schatz, MD 02/08/2022 4:02:16 PM This report has been signed  electronically.

## 2022-02-08 NOTE — Op Note (Signed)
[place] Patient Name: Melissa Conley Procedure Date: 02/08/2022 3:01 PM MRN: [id number] Endoscopist: Nicki Reaper E. Candis Schatz , MD Age: 61 Referring MD:  Date of Birth: [birth year] Gender: Female Account #: [id number] Procedure:                Upper GI endoscopy Indications:              Abdominal pain in the right upper quadrant Medicines:                Monitored Anesthesia Care Procedure:                Pre-Anesthesia Assessment:                           - Prior to the procedure, a History and Physical                            was performed, and patient medications and                            allergies were reviewed. The patient's tolerance of                            previous anesthesia was also reviewed. The risks                            and benefits of the procedure and the sedation                            options and risks were discussed with the patient.                            All questions were answered, and informed consent                            was obtained. Prior Anticoagulants: The patient has                            taken no previous anticoagulant or antiplatelet                            agents except for aspirin. ASA Grade Assessment:                            III - A patient with severe systemic disease. After                            reviewing the risks and benefits, the patient was                            deemed in satisfactory condition to undergo the                            procedure.  After obtaining informed consent, the endoscope was                            passed under direct vision. Throughout the                            procedure, the patient's blood pressure, pulse, and                            oxygen saturations were monitored continuously. The                            Endoscope was introduced through the mouth, and                            advanced to the third part of duodenum.  The upper                            GI endoscopy was accomplished without difficulty.                            The patient tolerated the procedure well. Scope In: Scope Out: Findings:                 The examined portions of the nasopharynx,                            oropharynx and larynx were normal.                           The examined esophagus was normal.                           Localized moderate mucosal changes characterized by                            ulceration/loss of mucosa were found in the gastric                            body. Biopsies were taken with a cold forceps for                            Helicobacter pylori testing. Estimated blood loss                            was minimal.                           The exam of the stomach was otherwise normal.                           The examined duodenum was normal. Complications:            No immediate complications. Estimated Blood Loss:     Estimated blood loss was minimal. Impression:               -  The examined portions of the nasopharynx,                            oropharynx and larynx were normal.                           - Normal esophagus.                           - Ulcerated mucosa in the gastric body. Biopsied.                           - Normal examined duodenum. Recommendation:           - Patient has a contact number available for                            emergencies. The signs and symptoms of potential                            delayed complications were discussed with the                            patient. Return to normal activities tomorrow.                            Written discharge instructions were provided to the                            patient.                           - Resume previous diet.                           - Continue present medications.                           - Await pathology results.                           - Further results will be based on pathology                             results. Nole Robey E. Candis Schatz, MD 02/08/2022 3:56:06 PM This report has been signed electronically.

## 2022-02-08 NOTE — Progress Notes (Unsigned)
VS by DT  Pt's states no medical or surgical changes since previsit or office visit.  

## 2022-02-08 NOTE — Progress Notes (Unsigned)
PT taken to PACU. Monitors in place. VSS. Report given to RN. 

## 2022-02-08 NOTE — Progress Notes (Unsigned)
History and Physical Interval Note:  02/08/2022 3:08 PM  Melissa Conley  has presented today for endoscopic procedure(s), with the diagnosis of  Encounter Diagnoses  Name Primary?   RUQ pain Yes   Screening for colon cancer   .  The various methods of evaluation and treatment have been discussed with the patient and/or family. After consideration of risks, benefits and other options for treatment, the patient has consented to  the endoscopic procedure(s).   The patient's history has been reviewed, patient examined, no change in status, stable for endoscopic procedure(s).  I have reviewed the patient's chart and labs.  Questions were answered to the patient's satisfaction.     Chelsae Zanella E. Candis Schatz, MD [facility]

## 2022-02-08 NOTE — Progress Notes (Unsigned)
Called to room to assist during endoscopic procedure.  Patient ID and intended procedure confirmed with present staff. Received instructions for my participation in the procedure from the performing physician.  

## 2022-02-23 NOTE — Progress Notes (Signed)
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  I did inform her that there were further tests still pending that may be needed before any treatment decisions can be made.  I informed her that we need to get a CT scan for staging and we will place a referral to oncology who will determine a treatment plan.  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   She will be due for surveillance colonoscopy in 3 years.  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 Irene Limbo or Dr. Benay Spice)?

## 2022-02-24 ENCOUNTER — Encounter: Payer: Self-pay | Admitting: *Deleted

## 2022-02-24 ENCOUNTER — Other Ambulatory Visit: Payer: Self-pay

## 2022-02-24 DIAGNOSIS — C884 Extranodal marginal zone b-cell lymphoma of mucosa-associated lymphoid tissue (malt-lymphoma) not having achieved remission: Secondary | ICD-10-CM

## 2022-02-24 NOTE — Progress Notes (Signed)
PATIENT NAVIGATOR PROGRESS NOTE  Name: Melissa Conley Date: 02/24/2022 MRN: [id number]  DOB: [birth year]   Reason for visit:  Introductory phone call  Comments:  Called patient re: New Patient appt with Dr Benay Spice for March 21, 2022 at 1:40 due to pt availability. Directions and parking reviewed with pt as well as one support person allowed in appt Verbalized understanding    Time spent counseling/coordinating care: 15-30 minutes

## 2022-03-01 ENCOUNTER — Telehealth: Payer: Self-pay | Admitting: Gastroenterology

## 2022-03-01 NOTE — Telephone Encounter (Signed)
Patients daughter called to get path results because she said the patient did not understand.

## 2022-03-01 NOTE — Telephone Encounter (Signed)
Dr. Candis Schatz please see note below. Would you call and explain the path results to the pts daughter, pt states she did not understand.

## 2022-03-06 ENCOUNTER — Ambulatory Visit (HOSPITAL_COMMUNITY): Payer: Medicare Other

## 2022-03-08 ENCOUNTER — Ambulatory Visit (HOSPITAL_COMMUNITY)
Admission: RE | Admit: 2022-03-08 | Discharge: 2022-03-08 | Disposition: A | Discharge status: Home / Self Care | Payer: Medicare Other | Source: Ambulatory Visit | Attending: Gastroenterology | Admitting: Gastroenterology

## 2022-03-08 DIAGNOSIS — C884 Extranodal marginal zone B-cell lymphoma of mucosa-associated lymphoid tissue [MALT-lymphoma]: Secondary | ICD-10-CM | POA: Diagnosis present

## 2022-03-08 MED ORDER — SODIUM CHLORIDE (PF) 0.9 % IJ SOLN
INTRAMUSCULAR | Status: AC
  Filled 2022-03-08: qty 50

## 2022-03-08 MED ORDER — IOHEXOL 300 MG/ML  SOLN
100.0000 mL | Freq: Once | INTRAMUSCULAR | Status: AC | PRN
  Administered 2022-03-08: 100 mL via INTRAVENOUS

## 2022-03-15 ENCOUNTER — Other Ambulatory Visit: Payer: Self-pay | Admitting: Gastroenterology

## 2022-03-15 DIAGNOSIS — R932 Abnormal findings on diagnostic imaging of liver and biliary tract: Secondary | ICD-10-CM

## 2022-03-15 NOTE — Progress Notes (Signed)
Melissa Conley,  Good news.  The CT scan did not show any evidence of lymphoma elsewhere in the body.  You had some mild dilation of the bile ducts, which can be seen after the gallbladder is removed, and which can also be associated with chronic narcotic (opioid) use.  I would like to check your liver enzymes again.  This can wait until after you meet with Dr. Benay Spice, as it is likely there will be some blood tests he will need to get as well.

## 2022-03-21 ENCOUNTER — Inpatient Hospital Stay: Payer: Medicare Other | Admitting: Oncology

## 2022-03-28 ENCOUNTER — Inpatient Hospital Stay: Payer: Medicare Other

## 2022-03-28 ENCOUNTER — Inpatient Hospital Stay: Payer: Medicare Other | Attending: Oncology | Admitting: Oncology

## 2022-03-28 ENCOUNTER — Inpatient Hospital Stay (HOSPITAL_BASED_OUTPATIENT_CLINIC_OR_DEPARTMENT_OTHER): Payer: Medicare Other | Admitting: Oncology

## 2022-03-28 ENCOUNTER — Encounter: Payer: Self-pay | Admitting: *Deleted

## 2022-03-28 VITALS — BP 94/62 | HR 71 | Temp 98.7°F | Resp 20 | Ht 65.0 in | Wt 172.2 lb

## 2022-03-28 DIAGNOSIS — Z808 Family history of malignant neoplasm of other organs or systems: Secondary | ICD-10-CM | POA: Diagnosis not present

## 2022-03-28 DIAGNOSIS — Z8041 Family history of malignant neoplasm of ovary: Secondary | ICD-10-CM | POA: Insufficient documentation

## 2022-03-28 DIAGNOSIS — C73 Malignant neoplasm of thyroid gland: Secondary | ICD-10-CM

## 2022-03-28 DIAGNOSIS — Z807 Family history of other malignant neoplasms of lymphoid, hematopoietic and related tissues: Secondary | ICD-10-CM

## 2022-03-28 DIAGNOSIS — M549 Dorsalgia, unspecified: Secondary | ICD-10-CM | POA: Diagnosis not present

## 2022-03-28 DIAGNOSIS — C884 Extranodal marginal zone B-cell lymphoma of mucosa-associated lymphoid tissue [MALT-lymphoma]: Secondary | ICD-10-CM | POA: Insufficient documentation

## 2022-03-28 LAB — CMP (CANCER CENTER ONLY)
ALT: 16 U/L (ref 0–44)
AST: 15 U/L (ref 15–41)
Albumin: 4.5 g/dL (ref 3.5–5.0)
Alkaline Phosphatase: 78 U/L (ref 38–126)
Anion gap: 9 (ref 5–15)
BUN: 13 mg/dL (ref 6–20)
CO2: 27 mmol/L (ref 22–32)
Calcium: 9.8 mg/dL (ref 8.9–10.3)
Chloride: 104 mmol/L (ref 98–111)
Creatinine: 0.99 mg/dL (ref 0.44–1.00)
GFR, Estimated: >60 mL/min (ref >60)
Glucose, Bld: 101 mg/dL — ABNORMAL HIGH (ref 70–99)
Potassium: 4.6 mmol/L (ref 3.5–5.1)
Sodium: 140 mmol/L (ref 135–145)
Total Bilirubin: 0.3 mg/dL (ref 0.3–1.2)
Total Protein: 7.1 g/dL (ref 6.5–8.1)

## 2022-03-28 LAB — CBC WITH DIFFERENTIAL (CANCER CENTER ONLY)
Abs Immature Granulocytes: 0.02 10*3/uL (ref 0.00–0.07)
Basophils Absolute: 0.1 10*3/uL (ref 0.0–0.1)
Basophils Relative: 1 %
Eosinophils Absolute: 0.2 10*3/uL (ref 0.0–0.5)
Eosinophils Relative: 3 %
HCT: 41.4 % (ref 36.0–46.0)
Hemoglobin: 13.2 g/dL (ref 12.0–15.0)
Immature Granulocytes: 0 %
Lymphocytes Relative: 33 %
Lymphs Abs: 2.5 10*3/uL (ref 0.7–4.0)
MCH: 28.3 pg (ref 26.0–34.0)
MCHC: 31.9 g/dL (ref 30.0–36.0)
MCV: 88.8 fL (ref 80.0–100.0)
Monocytes Absolute: 0.4 10*3/uL (ref 0.1–1.0)
Monocytes Relative: 5 %
Neutro Abs: 4.3 10*3/uL (ref 1.7–7.7)
Neutrophils Relative %: 58 %
Platelet Count: 322 10*3/uL (ref 150–400)
RBC: 4.66 MIL/uL (ref 3.87–5.11)
RDW: 12.4 % (ref 11.5–15.5)
WBC Count: 7.5 10*3/uL (ref 4.0–10.5)
nRBC: 0 % (ref 0.0–0.2)

## 2022-03-28 LAB — LACTATE DEHYDROGENASE: LDH: 116 U/L (ref 98–192)
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
## 2022-03-28 NOTE — Progress Notes (Signed)
PATIENT NAVIGATOR PROGRESS NOTE  Name: [name] Date: 03/28/2022 MRN: [id number]  DOB: 10-22-1960   Reason for visit:  Initial visit with Dr Benay Spice  Comments:  Met with Ms Echeverria and her daughter Anderson [place] during visit with Dr Benay Spice for Melissa Conley  Referral made to [facility] oncology Referral made to [place] drawn today and will call pt with results Given contact information to call with any questions or issues F/U with Dr Benay Spice in 2-3 months    Time spent counseling/coordinating care: > 60 minutes

## 2022-03-29 ENCOUNTER — Encounter: Payer: Self-pay | Admitting: *Deleted

## 2022-03-29 NOTE — Progress Notes (Signed)
PATIENT NAVIGATOR PROGRESS NOTE  Name: Melissa Conley Date: 03/29/2022 MRN: [id number]  DOB: Apr 25, 1961   Reason for visit:  Telephone visit  Comments:  Spoke with pt and her daughter and gave them lab results from yesterday and also to inform them that referral for [facility] was faxed this am.  If they have not heard from scheduling next week to call back to our office for further assistance    Time spent counseling/coordinating care: 15-30 minutes

## 2022-04-03 LAB — MULTIPLE MYELOMA PANEL, SERUM
Albumin SerPl Elph-Mcnc: 3.7 g/dL (ref 2.9–4.4)
Albumin/Glob SerPl: 1.2 (ref 0.7–1.7)
Alpha 1: 0.3 g/dL (ref 0.0–0.4)
Alpha2 Glob SerPl Elph-Mcnc: 0.7 g/dL (ref 0.4–1.0)
B-Globulin SerPl Elph-Mcnc: 0.9 g/dL (ref 0.7–1.3)
Gamma Glob SerPl Elph-Mcnc: 1.3 g/dL (ref 0.4–1.8)
Globulin, Total: 3.3 g/dL (ref 2.2–3.9)
IgA: 66 mg/dL — ABNORMAL LOW (ref 87–352)
IgG (Immunoglobin G), Serum: 604 mg/dL (ref 586–1602)
IgM (Immunoglobulin M), Srm: 1167 mg/dL — ABNORMAL HIGH (ref 26–217)
M Protein SerPl Elph-Mcnc: 0.4 g/dL — ABNORMAL HIGH
Total Protein ELP: 7 g/dL (ref 6.0–8.5)

## 2022-04-04 ENCOUNTER — Telehealth: Payer: Self-pay

## 2022-04-04 ENCOUNTER — Other Ambulatory Visit: Payer: Self-pay

## 2022-04-04 DIAGNOSIS — C884 Extranodal marginal zone B-cell lymphoma of mucosa-associated lymphoid tissue [MALT-lymphoma]: Secondary | ICD-10-CM

## 2022-04-04 NOTE — Telephone Encounter (Signed)
Pt verbalized understanding. Labs ordered for next visit.

## 2022-04-04 NOTE — Telephone Encounter (Signed)
-----   Message from Ladell Pier, MD sent at 04/03/2022  6:16 PM EDT ----- Please call patient, labs show low level monoclonal protein, likely associated with the lymphoma, f/u as scheduled, add cbc, spep, light chains, cmp next wisit, IgM

## 2022-06-13 ENCOUNTER — Inpatient Hospital Stay: Payer: Medicare Other | Admitting: Oncology

## 2022-08-25 ENCOUNTER — Telehealth: Payer: Self-pay | Admitting: Gastroenterology

## 2022-08-25 NOTE — Telephone Encounter (Signed)
Inbound call from patient daughter staeting patient has been referred to have another colon and egd procedure? Please advise.  Thank you

## 2022-08-25 NOTE — Telephone Encounter (Signed)
Pt scheduled to see Dr. Elvera Maria 09/28/22 at 11:10am. Pts daughter aware of appt.

## 2022-09-28 ENCOUNTER — Encounter: Payer: Self-pay | Admitting: Gastroenterology

## 2022-09-28 ENCOUNTER — Ambulatory Visit (INDEPENDENT_AMBULATORY_CARE_PROVIDER_SITE_OTHER): Payer: Medicare Other | Admitting: Gastroenterology

## 2022-09-28 VITALS — BP 98/68 | HR 78 | Ht 65.0 in | Wt 181.0 lb

## 2022-09-28 DIAGNOSIS — C884 Extranodal marginal zone B-cell lymphoma of mucosa-associated lymphoid tissue [MALT-lymphoma]: Secondary | ICD-10-CM

## 2022-09-28 DIAGNOSIS — R932 Abnormal findings on diagnostic imaging of liver and biliary tract: Secondary | ICD-10-CM | POA: Diagnosis not present

## 2022-09-28 DIAGNOSIS — K5903 Drug induced constipation: Secondary | ICD-10-CM

## 2022-09-28 MED ORDER — LUBIPROSTONE 24 MCG PO CAPS: 24.0000 ug | ORAL_CAPSULE | Freq: Two times a day (BID) | ORAL | 3 refills | Status: DC
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
## 2022-09-28 NOTE — Patient Instructions (Signed)
If you are age 59 or older, your body mass index should be between 23-30. Your Body mass index is 30.12 kg/m. If this is out of the aforementioned range listed, please consider follow up with your Primary Care Provider.  If you are age 51 or younger, your body mass index should be between 19-25. Your Body mass index is 30.12 kg/m. If this is out of the aformentioned range listed, please consider follow up with your Primary Care Provider.   You have been scheduled for an endoscopy. Please follow written instructions given to you at your visit today. If you use inhalers (even only as needed), please bring them with you on the day of your procedure.  We have sent the following medications to your pharmacy for you to pick up at your convenience: Amitiza 24 mcg twice daily.  The [facility] providers would like to encourage you to use [facility] to communicate with providers for non-urgent requests or questions.  Due to long hold times on the telephone, sending your provider a message by [facility] may be a faster and more efficient way to get a response.  Please allow 48 business hours for a response.  Please remember that this is for non-urgent requests.   It was a pleasure to see you today!  Thank you for trusting me with your gastrointestinal care!    Scott E.Candis Schatz, MD

## 2022-09-29 ENCOUNTER — Other Ambulatory Visit: Payer: Self-pay

## 2022-09-29 DIAGNOSIS — C884 Extranodal marginal zone B-cell lymphoma of mucosa-associated lymphoid tissue [MALT-lymphoma]: Secondary | ICD-10-CM

## 2022-10-02 ENCOUNTER — Telehealth: Payer: Self-pay

## 2022-10-02 NOTE — Telephone Encounter (Signed)
Contacted patient and informed her lab orders have been placed and that she could come to the lab between 8-5 to have blood work. I also let her know that radiology scheduling would call her to schedule ultrasound.

## 2022-10-04 ENCOUNTER — Encounter: Payer: Medicare Other | Admitting: Gastroenterology

## 2022-10-17 ENCOUNTER — Ambulatory Visit (HOSPITAL_COMMUNITY)
Admission: RE | Admit: 2022-10-17 | Discharge: 2022-10-17 | Disposition: A | Discharge status: Home / Self Care | Payer: 59 | Source: Ambulatory Visit | Attending: Gastroenterology | Admitting: Gastroenterology

## 2022-10-17 DIAGNOSIS — C884 Extranodal marginal zone B-cell lymphoma of mucosa-associated lymphoid tissue [MALT-lymphoma]: Secondary | ICD-10-CM

## 2022-10-17 NOTE — Progress Notes (Signed)
Melissa Conley,  Your ultrasound demonstrated significant dilation of your common bile duct.  Dilation of the common bile duct typically indicates that something is impeding the flow of bile into the intestine.  Possibilities include gallstones, tumors and strictures (narrowing).  I am curious as to whether your radiation treatments could have caused a stricture in your bile duct.  We need to investigate this further. I would like to check your liver enzymes to see if there is any evidence of liver inflammation or biliary obstruction.  I would also like to get an MRI of the bile ducts which provides a much more detailed picture of this area.   Melissa Conley,  Can you please order an MRCP, a hepatic panel and GGT?

## 2022-10-18 ENCOUNTER — Other Ambulatory Visit: Payer: Self-pay

## 2022-10-18 DIAGNOSIS — R932 Abnormal findings on diagnostic imaging of liver and biliary tract: Secondary | ICD-10-CM

## 2022-10-19 ENCOUNTER — Ambulatory Visit (AMBULATORY_SURGERY_CENTER): Payer: 59 | Admitting: Gastroenterology

## 2022-10-19 ENCOUNTER — Encounter: Payer: Self-pay | Admitting: Gastroenterology

## 2022-10-19 ENCOUNTER — Other Ambulatory Visit (INDEPENDENT_AMBULATORY_CARE_PROVIDER_SITE_OTHER): Payer: 59

## 2022-10-19 VITALS — BP 112/66 | HR 60 | Temp 97.5°F | Resp 14 | Ht 65.0 in | Wt 181.0 lb

## 2022-10-19 DIAGNOSIS — R932 Abnormal findings on diagnostic imaging of liver and biliary tract: Secondary | ICD-10-CM | POA: Diagnosis not present

## 2022-10-19 DIAGNOSIS — K3189 Other diseases of stomach and duodenum: Secondary | ICD-10-CM | POA: Diagnosis not present

## 2022-10-19 DIAGNOSIS — K319 Disease of stomach and duodenum, unspecified: Secondary | ICD-10-CM | POA: Diagnosis not present

## 2022-10-19 DIAGNOSIS — C884 Extranodal marginal zone B-cell lymphoma of mucosa-associated lymphoid tissue [MALT-lymphoma]: Secondary | ICD-10-CM

## 2022-10-19 LAB — HEPATIC FUNCTION PANEL
ALT: 68 U/L — ABNORMAL HIGH (ref 0–35)
AST: 72 U/L — ABNORMAL HIGH (ref 0–37)
Albumin: 4.1 g/dL (ref 3.5–5.2)
Alkaline Phosphatase: 186 U/L — ABNORMAL HIGH (ref 39–117)
Bilirubin, Direct: 0.1 mg/dL (ref 0.0–0.3)
Total Bilirubin: 0.5 mg/dL (ref 0.2–1.2)
Total Protein: 7.1 g/dL (ref 6.0–8.3)

## 2022-10-19 LAB — GAMMA GT: GGT: 213 U/L — ABNORMAL HIGH (ref 7–51)

## 2022-10-19 MED ORDER — SODIUM CHLORIDE 0.9 % IV SOLN: 500.0000 mL | Freq: Once | INTRAVENOUS | Status: DC

## 2022-10-19 NOTE — Progress Notes (Signed)
VSS NAD TRANS TO PACU  

## 2022-10-19 NOTE — Progress Notes (Signed)
Pt's states no medical or surgical changes since previsit or office visit. 

## 2022-10-19 NOTE — Op Note (Signed)
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 The patient's tolerance of                            previous anesthesia was also reviewed. The risks                            and benefits of the procedure and the sedation                            options and risks were discussed with the patient.                            All questions were answered, and informed consent                            was obtained. Prior Anticoagulants: The patient has                            taken no anticoagulant or antiplatelet agents. ASA                            Grade Assessment: III - A patient with severe                            systemic disease. After reviewing the risks and                            benefits, the patient was deemed in satisfactory                            condition to undergo the procedure.                           After obtaining informed consent, the endoscope was  passed under direct vision. Throughout the                            procedure, the patient's blood pressure, pulse, and                            oxygen saturations were monitored continuously. The                            GIF [id number] #[id number] was introduced through the                            mouth, and advanced to the second part of duodenum.                            The  upper GI endoscopy was accomplished without                            difficulty. The patient tolerated the procedure                            well. Scope In: Scope Out: Findings:                 The examined portions of the nasopharynx,                            oropharynx and larynx were normal.                           The examined esophagus was normal.                           Localized moderate mucosal changes characterized by                            punctate ulceration/denuded mucosa were found on                            the anterior wall (lesser curvature) of the gastric                            body and a focal area along the distal greater                            curvature of the gastric body. Biopsies were taken                            with a cold forceps for Helicobacter pylori                            testing. Estimated blood loss was minimal.                           A separate localized area of focal mucosal  changes                            characterized by ulceration/denuded mucosa was                            found in the gastric body, only a few millimeters                            in size. Biopsies were taken with a cold forceps                            for histology. Biopsies were taken with a cold                            forceps for Helicobacter pylori testing. Estimated                            blood loss was minimal.                           Diffuse mild mucosal changes characterized by                            erythema were found in the entire examined stomach.                            Biopsies were taken with a cold forceps for                            Helicobacter pylori testing.                           The examined duodenum was normal. Complications:            No immediate complications. Estimated Blood Loss:     Estimated blood loss was minimal. Impression:               - The examined portions of the nasopharynx,                             oropharynx and larynx were normal.                           - Normal esophagus.                           - Abnormal appearing focal ulcerated mucosa in the                            anterior wall of the gastric body and greater                            curvature of the gastric body. Biopsied.                           -  Focal ulcerated mucosa in the gastric body.                            Biopsied.                           - Erythematous mucosa in the stomach. Biopsied.                            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                           - Normal examined duodenum.                           - Findings are suggestive of persistent MALT,                            possibly with progression. Recommendation:           - Patient has a contact number available for                            emergencies. The signs and symptoms of potential                            delayed complications were discussed with the                            patient. Return to normal activities tomorrow.                            Written discharge instructions were provided to the                            patient.                           - Resume previous diet.                           - Continue present medications.                           - Await pathology results.                           - Follow up with oncologist. Gladstone Pih. Candis Schatz, MD 10/19/2022 3:32:07 PM This report has been signed electronically.

## 2022-10-19 NOTE — Progress Notes (Signed)
History and Physical Interval Note:  10/19/2022 2:59 PM  Melissa Conley  has presented today for endoscopic procedure(s), with the diagnosis of  Encounter Diagnosis  Name Primary?   MALT lymphoma ([place]) Yes  .  The various methods of evaluation and treatment have been discussed with the patient and/or family. After consideration of risks, benefits and other options for treatment, the patient has consented to  the endoscopic procedure(s).   The patient's history has been reviewed, patient examined, no change in status, stable for endoscopic procedure(s).  I have reviewed the patient's chart and labs.  Questions were answered to the patient's satisfaction.     Ansar Skoda E. Candis Schatz, MD [facility]

## 2022-10-19 NOTE — Progress Notes (Signed)
Called to room to assist during endoscopic procedure.  Patient ID and intended procedure confirmed with present staff. Received instructions for my participation in the procedure from the performing physician.  

## 2022-10-19 NOTE — Patient Instructions (Signed)
Resume all of your regular medications today. Use salt water gargles and/or Chloraseptic spray as needed per Dr Candis Schatz.   YOU HAD AN ENDOSCOPIC PROCEDURE TODAY AT THE [facility]:   Refer to the procedure report that was given to you for any specific questions about what was found during the examination.  If the procedure report does not answer your questions, please call your gastroenterologist to clarify.  If you requested that your care partner not be given the details of your procedure findings, then the procedure report has been included in a sealed envelope for you to review at your convenience later.  YOU SHOULD EXPECT: Some feelings of bloating in the abdomen. Passage of more gas than usual.  Walking can help get rid of the air that was put into your GI tract during the procedure and reduce the bloating.   Please Note:  You might notice some irritation and congestion in your nose or some drainage.  This is from the oxygen used during your procedure.  There is no need for concern and it should clear up in a day or so.  SYMPTOMS TO REPORT IMMEDIATELY:  Following upper endoscopy (EGD)  Vomiting of blood or coffee ground material  New chest pain or pain under the shoulder blades  Painful or persistently difficult swallowing  New shortness of breath  Fever of 100F or higher  Black, tarry-looking stools  For urgent or emergent issues, a gastroenterologist can be reached at any hour by calling [phone]. Do not use MyChart messaging for urgent concerns.    DIET:  We do recommend a small meal at first, but then you may proceed to your regular diet. Avoid spicy and acidic foods.  Drink plenty of fluids but you should avoid alcoholic beverages for 24 hours.  ACTIVITY:  You should plan to take it easy for the rest of today and you should NOT DRIVE or use heavy machinery until tomorrow (because of the sedation medicines used during the test).    FOLLOW UP: Our staff will  call the number listed on your records the next business day following your procedure.  We will call around 7:15- 8:00 am to check on you and address any questions or concerns that you may have regarding the information given to you following your procedure. If we do not reach you, we will leave a message.     If any biopsies were taken you will be contacted by phone or by letter within the next 1-3 weeks.  Please call us at [phone] if you have not heard about the biopsies in 3 weeks.    SIGNATURES/CONFIDENTIALITY: You and/or your care partner have signed paperwork which will be entered into your electronic medical record.  These signatures attest to the fact that that the information above on your After Visit Summary has been reviewed and is understood.  Full responsibility of the confidentiality of this discharge information lies with you and/or your care-partner.

## 2022-10-20 ENCOUNTER — Telehealth: Payer: Self-pay | Admitting: *Deleted

## 2022-10-20 NOTE — Telephone Encounter (Signed)
  Follow up Call-     10/19/2022    2:31 PM 02/08/2022    2:44 PM  Call back number  Post procedure Call Back phone  # [phone]  Permission to leave phone message Yes Yes     Patient questions:  Do you have a fever, pain , or abdominal swelling? No. Pain Score  0 *  Have you tolerated food without any problems? Yes.    Have you been able to return to your normal activities? Yes.    Do you have any questions about your discharge instructions: Diet   No. Medications  No. Follow up visit  No.  Do you have questions or concerns about your Care? No.  Patient stated she was sick on her stomach last night once, but feeling better now.   Actions: * If pain score is 4 or above: No action needed, pain <4.

## 2022-10-25 NOTE — Progress Notes (Signed)
Melissa Conley,  Your liver enzymes were mildly elevated.  This may be related to the dilated bile duct seen on ultrasound.  We will have further information after your MRI is complete.

## 2022-10-27 ENCOUNTER — Ambulatory Visit (HOSPITAL_COMMUNITY)
Admission: RE | Admit: 2022-10-27 | Discharge: 2022-10-27 | Disposition: A | Discharge status: Home / Self Care | Payer: 59 | Source: Ambulatory Visit | Attending: Gastroenterology | Admitting: Gastroenterology

## 2022-10-27 DIAGNOSIS — R932 Abnormal findings on diagnostic imaging of liver and biliary tract: Secondary | ICD-10-CM | POA: Diagnosis not present

## 2022-10-27 MED ORDER — GADOBUTROL 1 MMOL/ML IV SOLN
8.2000 mL | Freq: Once | INTRAVENOUS | Status: AC | PRN
  Administered 2022-10-27: 8.2 mL via INTRAVENOUS

## 2022-11-02 NOTE — Progress Notes (Signed)
Melissa Conley,  The biopsies of your stomach did not show any evidence of residual lymphoma.  Additionally, no H. Pylori was seen.  The abnormal appearance of your stomach is most likely due to changes from the radiation and healing of the the lymphoma. This is good news. Please follow up with your oncologist for ongoing treatment and surveillance recommendations.

## 2022-11-16 ENCOUNTER — Telehealth: Payer: Self-pay

## 2022-11-16 NOTE — Telephone Encounter (Signed)
-----   Message from Daryel November, MD sent at 11/16/2022 12:04 PM EST ----- Regarding: Nonurgent EUS  GM,  This is the lady we spoke about.  Recently diagnosed gastric MALT s/p XRT.  Persistent dilated CBD/intrahepatic dilation.  Liver enzymes mildly elevated, Tbili normal.  Please add her to list for routine EUS.  Thanks,  [place]

## 2022-11-17 ENCOUNTER — Other Ambulatory Visit: Payer: Self-pay

## 2022-11-17 ENCOUNTER — Other Ambulatory Visit (HOSPITAL_COMMUNITY): Payer: Self-pay

## 2022-11-17 DIAGNOSIS — R932 Abnormal findings on diagnostic imaging of liver and biliary tract: Secondary | ICD-10-CM

## 2022-11-17 NOTE — Telephone Encounter (Signed)
-----   Message from Irving Copas., MD sent at 11/17/2022  5:42 AM EST ----- Regarding: RE: Nonurgent EUS SEC, Thanks for sending this. Agree with nonurgent EUS as next step for patient.  Melissa Conley, Please put in our EUS list for scheduling as able. Thanks. GM ----- Message ----- From: Daryel November, MD Sent: 11/16/2022  12:05 PM EST To: Timothy Lasso, RN; Irving Copas., MD Subject: Nonurgent EUS                                   GM,  This is the lady we spoke about.  Recently diagnosed gastric MALT s/p XRT.  Persistent dilated CBD/intrahepatic dilation.  Liver enzymes mildly elevated, Tbili normal.  Please add her to list for routine EUS.  Thanks,  [place]

## 2022-11-17 NOTE — Telephone Encounter (Signed)
EUS has been scheduled for 12/28/22 at 215 pm with GM at [facility]   Left message on machine to call back

## 2022-11-20 NOTE — Telephone Encounter (Signed)
The patient has been notified of this information and all questions answered.   EUS scheduled, pt instructed and medications reviewed.  Patient instructions mailed to home.  Patient to call with any questions or concerns.

## 2022-11-23 ENCOUNTER — Other Ambulatory Visit (HOSPITAL_COMMUNITY): Payer: Self-pay

## 2022-12-21 ENCOUNTER — Encounter (HOSPITAL_COMMUNITY): Payer: Self-pay | Admitting: Gastroenterology

## 2022-12-22 NOTE — Progress Notes (Signed)
Attempted to obtain medical history via telephone, unable to reach at this time. HIPAA compliant voicemail message left requesting return call to pre surgical testing department. 

## 2022-12-28 ENCOUNTER — Encounter (HOSPITAL_COMMUNITY): Admission: RE | Payer: Self-pay | Source: Home / Self Care

## 2022-12-28 ENCOUNTER — Telehealth: Payer: Self-pay | Admitting: Gastroenterology

## 2022-12-28 ENCOUNTER — Ambulatory Visit (HOSPITAL_COMMUNITY): Admission: RE | Admit: 2022-12-28 | Payer: 59 | Source: Home / Self Care | Admitting: Gastroenterology

## 2022-12-28 SURGERY — UPPER ENDOSCOPIC ULTRASOUND (EUS) RADIAL
Anesthesia: Monitor Anesthesia Care

## 2022-12-28 NOTE — Telephone Encounter (Signed)
The pt needs to cancel for today she has a stomach virus. I will call her with another appt.  She is aware that I will reach out to her as soon as able.

## 2022-12-28 NOTE — Telephone Encounter (Signed)
PT is scheduled for an EUS today at [facility] 230pm and she has a stomach virus. She would like to be contacted to be advised of the next availabilities.

## 2022-12-28 NOTE — Telephone Encounter (Signed)
Thanks Patty. GM 

## 2023-01-02 ENCOUNTER — Other Ambulatory Visit: Payer: Self-pay

## 2023-01-02 NOTE — Telephone Encounter (Signed)
The pt has been rescheduled to 02/26/23 at 8 am at [facility] with GM   Left message on machine to call back

## 2023-01-02 NOTE — Telephone Encounter (Signed)
EUS scheduled, pt instructed and medications reviewed.  Patient instructions mailed to home.  Patient to call with any questions or concerns.  

## 2023-01-04 NOTE — Telephone Encounter (Signed)
Done

## 2023-02-15 ENCOUNTER — Telehealth: Payer: Self-pay | Admitting: Gastroenterology

## 2023-02-15 NOTE — Telephone Encounter (Signed)
Patient is calling states patient has not been doing too well, she has been feeling sick on her stomach and Dr Alcide Evener recommended her to reach out to [place] to see if Dr Tomasa Rand would be able to prescribe something for the nausea and vomiting. Please advise

## 2023-02-15 NOTE — Telephone Encounter (Signed)
Pts daughter states that her mother has been nauseated and unable to eat much. States Dr. Truett Perna instructed them to contact Dr. Milas Hock office to come up with a nausea regimen for her. Dr. Truett Perna will not see her until after pt sees Dr. Meridee Score. Please advise.

## 2023-02-16 MED ORDER — ONDANSETRON HCL 8 MG PO TABS: 8.0000 mg | ORAL_TABLET | Freq: Three times a day (TID) | ORAL | 1 refills | Status: DC | PRN

## 2023-02-16 NOTE — Telephone Encounter (Signed)
Left detailed message on pts daughter's phone regarding recommendations per Dr. Tomasa Rand. Script sent to pharmacy.

## 2023-02-19 ENCOUNTER — Encounter (HOSPITAL_COMMUNITY): Payer: Self-pay | Admitting: Gastroenterology

## 2023-02-24 NOTE — Anesthesia Preprocedure Evaluation (Signed)
Anesthesia Evaluation  Patient identified by MRN, date of birth, ID band Patient awake    Reviewed: Allergy & Precautions, H&P , NPO status , Patient's Chart, lab work & pertinent test results  Airway Mallampati: II  TM Distance: >3 FB Neck ROM: Full    Dental no notable dental hx. (+) Teeth Intact, Missing, Dental Advisory Given,    Pulmonary pneumonia, COPD,  COPD inhaler, former smoker   Pulmonary exam normal breath sounds clear to auscultation       Cardiovascular + angina at rest negative cardio ROS Normal cardiovascular exam Rhythm:Regular Rate:Normal  ECHO 19 - Left ventricle: The cavity size was normal. Wall thickness was    normal. Systolic function was normal. The estimated ejection    fraction was in the range of 60% to 65%. Wall motion was normal;    there were no regional wall motion abnormalities. Left    ventricular diastolic function parameters were normal.  - Mitral valve: There was mild regurgitation.  - Left atrium: The atrium was mildly to moderately dilated.  - Right ventricle: The cavity size was mildly dilated. Wall    thickness was normal.  - Right atrium: The atrium was mildly dilated.   Coronary CT 22 IMPRESSION: 1. Minimal mixed non-obstructive CAD, CADRADS = 1.   2. Coronary calcium score of 25.7. This was 82nd percentile for age and sex matched control.   3. Normal coronary origin with right dominance.   4. Consider non-coronary causes of chest pain.    Neuro/Psych  Headaches PSYCHIATRIC DISORDERS Anxiety      Neuromuscular disease negative neurological ROS  negative psych ROS   GI/Hepatic negative GI ROS, Neg liver ROS,GERD  Medicated,,  Endo/Other  negative endocrine ROS    Renal/GU negative Renal ROS  negative genitourinary   Musculoskeletal negative musculoskeletal ROS (+) Arthritis , Osteoarthritis,    Abdominal Normal abdominal exam  (+)   Peds negative pediatric ROS (+)   Hematology negative hematology ROS (+)   Anesthesia Other Findings   Reproductive/Obstetrics negative OB ROS                             Anesthesia Physical Anesthesia Plan  ASA: 3  Anesthesia Plan: General   Post-op Pain Management: Minimal or no pain anticipated   Induction: Intravenous and Cricoid pressure planned  PONV Risk Score and Plan: 1 and Propofol infusion  Airway Management Planned: Oral ETT  Additional Equipment: None  Intra-op Plan:   Post-operative Plan: Extubation in OR  Informed Consent:   Plan Discussed with: Anesthesiologist and CRNA  Anesthesia Plan Comments: (  )        Anesthesia Quick Evaluation

## 2023-02-26 ENCOUNTER — Ambulatory Visit (HOSPITAL_BASED_OUTPATIENT_CLINIC_OR_DEPARTMENT_OTHER): Payer: 59 | Admitting: Certified Registered"

## 2023-02-26 ENCOUNTER — Other Ambulatory Visit: Payer: Self-pay

## 2023-02-26 ENCOUNTER — Ambulatory Visit (HOSPITAL_COMMUNITY)
Admission: RE | Admit: 2023-02-26 | Discharge: 2023-02-26 | Disposition: A | Discharge status: Home / Self Care | Payer: 59 | Attending: Gastroenterology | Admitting: Gastroenterology

## 2023-02-26 ENCOUNTER — Ambulatory Visit (HOSPITAL_COMMUNITY): Payer: 59 | Admitting: Certified Registered"

## 2023-02-26 ENCOUNTER — Encounter (HOSPITAL_COMMUNITY)
Admission: RE | Disposition: A | Discharge status: Home / Self Care | Payer: Self-pay | Source: Home / Self Care | Attending: Gastroenterology

## 2023-02-26 ENCOUNTER — Encounter (HOSPITAL_COMMUNITY): Payer: Self-pay | Admitting: Gastroenterology

## 2023-02-26 DIAGNOSIS — K3189 Other diseases of stomach and duodenum: Secondary | ICD-10-CM

## 2023-02-26 DIAGNOSIS — Q453 Other congenital malformations of pancreas and pancreatic duct: Secondary | ICD-10-CM

## 2023-02-26 DIAGNOSIS — I340 Nonrheumatic mitral (valve) insufficiency: Secondary | ICD-10-CM | POA: Insufficient documentation

## 2023-02-26 DIAGNOSIS — J449 Chronic obstructive pulmonary disease, unspecified: Secondary | ICD-10-CM | POA: Diagnosis not present

## 2023-02-26 DIAGNOSIS — Z85028 Personal history of other malignant neoplasm of stomach: Secondary | ICD-10-CM | POA: Diagnosis not present

## 2023-02-26 DIAGNOSIS — K317 Polyp of stomach and duodenum: Secondary | ICD-10-CM | POA: Insufficient documentation

## 2023-02-26 DIAGNOSIS — K838 Other specified diseases of biliary tract: Secondary | ICD-10-CM

## 2023-02-26 DIAGNOSIS — K589 Irritable bowel syndrome without diarrhea: Secondary | ICD-10-CM | POA: Insufficient documentation

## 2023-02-26 DIAGNOSIS — F419 Anxiety disorder, unspecified: Secondary | ICD-10-CM | POA: Insufficient documentation

## 2023-02-26 DIAGNOSIS — Z87891 Personal history of nicotine dependence: Secondary | ICD-10-CM

## 2023-02-26 DIAGNOSIS — M1990 Unspecified osteoarthritis, unspecified site: Secondary | ICD-10-CM | POA: Diagnosis not present

## 2023-02-26 DIAGNOSIS — K869 Disease of pancreas, unspecified: Secondary | ICD-10-CM

## 2023-02-26 DIAGNOSIS — K219 Gastro-esophageal reflux disease without esophagitis: Secondary | ICD-10-CM | POA: Diagnosis not present

## 2023-02-26 DIAGNOSIS — R1013 Epigastric pain: Secondary | ICD-10-CM | POA: Diagnosis present

## 2023-02-26 DIAGNOSIS — G894 Chronic pain syndrome: Secondary | ICD-10-CM | POA: Insufficient documentation

## 2023-02-26 DIAGNOSIS — K449 Diaphragmatic hernia without obstruction or gangrene: Secondary | ICD-10-CM

## 2023-02-26 DIAGNOSIS — I2510 Atherosclerotic heart disease of native coronary artery without angina pectoris: Secondary | ICD-10-CM | POA: Insufficient documentation

## 2023-02-26 DIAGNOSIS — R7989 Other specified abnormal findings of blood chemistry: Secondary | ICD-10-CM

## 2023-02-26 DIAGNOSIS — K760 Fatty (change of) liver, not elsewhere classified: Secondary | ICD-10-CM

## 2023-02-26 DIAGNOSIS — I899 Noninfective disorder of lymphatic vessels and lymph nodes, unspecified: Secondary | ICD-10-CM | POA: Diagnosis not present

## 2023-02-26 HISTORY — PX: BIOPSY: SHX5522

## 2023-02-26 HISTORY — PX: ESOPHAGOGASTRODUODENOSCOPY: SHX5428

## 2023-02-26 HISTORY — PX: EUS: SHX5427

## 2023-02-26 SURGERY — UPPER ENDOSCOPIC ULTRASOUND (EUS) RADIAL
Anesthesia: Monitor Anesthesia Care

## 2023-02-26 SURGERY — UPPER ESOPHAGEAL ENDOSCOPIC ULTRASOUND (EUS)
Anesthesia: Monitor Anesthesia Care

## 2023-02-26 MED ORDER — PROPOFOL 10 MG/ML IV BOLUS
INTRAVENOUS | Status: AC
  Filled 2023-02-26: qty 20

## 2023-02-26 MED ORDER — ROCURONIUM BROMIDE 100 MG/10ML IV SOLN
INTRAVENOUS | Status: DC | PRN
  Administered 2023-02-26: 10 mg via INTRAVENOUS

## 2023-02-26 MED ORDER — ONDANSETRON HCL 4 MG/2ML IJ SOLN
INTRAMUSCULAR | Status: AC
  Filled 2023-02-26: qty 2

## 2023-02-26 MED ORDER — FENTANYL CITRATE (PF) 100 MCG/2ML IJ SOLN
INTRAMUSCULAR | Status: AC
  Filled 2023-02-26: qty 2

## 2023-02-26 MED ORDER — PROCHLORPERAZINE MALEATE 10 MG PO TABS: 10.0000 mg | ORAL_TABLET | Freq: Four times a day (QID) | ORAL | 0 refills | Status: AC | PRN

## 2023-02-26 MED ORDER — PROPOFOL 500 MG/50ML IV EMUL
INTRAVENOUS | Status: DC | PRN
  Administered 2023-02-26: 150 ug/kg/min via INTRAVENOUS

## 2023-02-26 MED ORDER — AMISULPRIDE (ANTIEMETIC) 5 MG/2ML IV SOLN
10.0000 mg | Freq: Once | INTRAVENOUS | Status: AC
  Administered 2023-02-26: 10 mg via INTRAVENOUS
  Filled 2023-02-26: qty 4

## 2023-02-26 MED ORDER — DEXMEDETOMIDINE HCL IN NACL 80 MCG/20ML IV SOLN
INTRAVENOUS | Status: AC
  Filled 2023-02-26: qty 20

## 2023-02-26 MED ORDER — DEXAMETHASONE SODIUM PHOSPHATE 10 MG/ML IJ SOLN
INTRAMUSCULAR | Status: DC | PRN
  Administered 2023-02-26: 5 mg via INTRAVENOUS

## 2023-02-26 MED ORDER — FENTANYL CITRATE (PF) 100 MCG/2ML IJ SOLN
INTRAMUSCULAR | Status: DC | PRN
  Administered 2023-02-26: 50 ug via INTRAVENOUS

## 2023-02-26 MED ORDER — PROPOFOL 10 MG/ML IV BOLUS
INTRAVENOUS | Status: DC | PRN
  Administered 2023-02-26: 20 mg via INTRAVENOUS
  Administered 2023-02-26: 150 mg via INTRAVENOUS

## 2023-02-26 MED ORDER — ONDANSETRON HCL 4 MG/2ML IJ SOLN
4.0000 mg | Freq: Once | INTRAMUSCULAR | Status: AC
  Administered 2023-02-26: 4 mg via INTRAVENOUS

## 2023-02-26 MED ORDER — LACTATED RINGERS IV SOLN: INTRAVENOUS | Status: DC | PRN

## 2023-02-26 MED ORDER — ONDANSETRON HCL 4 MG/2ML IJ SOLN
INTRAMUSCULAR | Status: DC | PRN
  Administered 2023-02-26: 4 mg via INTRAVENOUS

## 2023-02-26 MED ORDER — LIDOCAINE 2% (20 MG/ML) 5 ML SYRINGE
INTRAMUSCULAR | Status: DC | PRN
  Administered 2023-02-26: 50 mg via INTRAVENOUS

## 2023-02-26 MED ORDER — SUCCINYLCHOLINE CHLORIDE 200 MG/10ML IV SOSY
PREFILLED_SYRINGE | INTRAVENOUS | Status: DC | PRN
  Administered 2023-02-26: 140 mg via INTRAVENOUS

## 2023-02-26 NOTE — Anesthesia Procedure Notes (Signed)
Procedure Name: Intubation Date/Time: 02/26/2023 8:06 AM  Performed by: Marny Lowenstein, CRNAPre-anesthesia Checklist: Patient identified, Emergency Drugs available, Suction available and Patient being monitored Patient Re-evaluated:Patient Re-evaluated prior to induction Oxygen Delivery Method: Circle system utilized Preoxygenation: Pre-oxygenation with 100% oxygen Induction Type: IV induction, Rapid sequence and Cricoid Pressure applied Laryngoscope Size: 2 Grade View: Grade I Tube type: Oral Tube size: 7.0 mm Number of attempts: 1 Airway Equipment and Method: Stylet Placement Confirmation: ETT inserted through vocal cords under direct vision, positive ETCO2 and breath sounds checked- equal and bilateral Secured at: 21 cm Tube secured with: Tape Dental Injury: Teeth and Oropharynx as per pre-operative assessment

## 2023-02-26 NOTE — H&P (Addendum)
GASTROENTEROLOGY PROCEDURE H&P NOTE   Primary Care Physician: Quitman Livings, MD  HPI: Melissa Conley is a 62 y.o. female who presents for EGD/EUS to evaluate biliary ductal dilation and abdominal pain.  Past Medical History:  Diagnosis Date   Angina at rest    Anxiety    hx (03/26/2018)   Bulging lumbar disc 03/28/2016   Overview:  L4-5, left   Chronic bronchitis (HCC)    Chronic lower back pain    Chronic pain syndrome 11/02/2015   Common migraine    "monthly" (03/26/2018)   Dysuria 01/31/2017   Fall from high place 03/25/2018   "deck; fell ~ 32ft; broke both feet"   Frequent headaches    "qod" (03/26/2018)   GERD without esophagitis 01/28/2018   History of abnormal mammogram 12/20/2016   IBS (irritable bowel syndrome)    Lumbar radiculopathy 11/02/2015   Menopausal disorder 01/05/2016   Mucosa-associated lymphoid tissue (MALT) lymphoma (HCC) 2023   Neck pain 11/02/2015   Numbness and tingling of both legs below knees 02/10/2016   Ovarian cancer (HCC) 1990   Pneumonia    "twice" (03/26/2018)   Postmenopausal HRT (hormone replacement therapy) 01/31/2017   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/19/2016   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    Thoracic back pain 11/02/2015   Thyroid cancer (HCC) 2022   Unstable angina (HCC) 01/28/2018   Past Surgical History:  Procedure Laterality Date   BACK SURGERY     CARDIAC CATHETERIZATION     CHOLECYSTECTOMY OPEN     DILATION AND CURETTAGE OF UTERUS     ORIF ANKLE FRACTURE Right 03/27/2018   Procedure: OPEN REDUCTION INTERNAL FIXATION (ORIF) ANKLE FRACTURE;  Surgeon: Jodi Geralds, MD;  Location: [facility] OR;  Service: Orthopedics;  Laterality: Right;   POSTERIOR LUMBAR FUSION  2000   SKIN CANCER EXCISION     eyelid/face   THYROIDECTOMY     TUBAL LIGATION     VAGINAL HYSTERECTOMY     No current facility-administered medications for this encounter.   No current  facility-administered medications for this encounter. Allergies  Allergen Reactions   Haemophilus B Polysaccharide Vaccine Hives    Could not breath, ended up being hospitalized  Could not breath, ended up being hospitalized  Could not breath, ended up being hospitalized   Influenza Virus Vaccine Hives    Could not breath, ended up being hospitalized   Penicillins Anaphylaxis and Hives    Has patient had a PCN reaction causing immediate rash, facial/tongue/throat swelling, SOB or lightheadedness with hypotension: Yes Has patient had a PCN reaction causing severe rash involving mucus membranes or skin necrosis: No Has patient had a PCN reaction that required hospitalization: Yes Has patient had a PCN reaction occurring within the last 10 years: Yes If all of the above answers are "NO", then may proceed with Cephalosporin use.     Gabapentin Other (See Comments)    GI Upset     Tapentadol Nausea Only   Methocarbamol Nausea Only   Morphine Nausea And Vomiting   Tizanidine Nausea Only   Family History  Problem Relation Age of Onset   Heart attack Mother    Lung cancer Mother    Lymphoma Mother    Colon polyps Mother    Heart disease Father    Ovarian cancer Sister    Brain cancer Sister    Diabetes Sister    Colon polyps Sister    Colon cancer Brother  Bone cancer Brother    Liver cancer Brother    Diabetes Brother    Colon polyps Brother    Irritable bowel syndrome Son    Diabetes Cousin    Irritable bowel syndrome Granddaughter    Esophageal cancer Neg Hx    Rectal cancer Neg Hx    Stomach cancer Neg Hx    Social History   Socioeconomic History   Marital status: Widowed    Spouse name: Not on file   Number of children: 3   Years of education: Not on file   Highest education level: 10th grade  Occupational History   Not on file  Tobacco Use   Smoking status: Former    Packs/day: 1.50    Years: 30.00    Additional pack years: 0.00    Total pack years:  45.00    Types: Cigarettes    Quit date: 2010    Years since quitting: 14.4   Smokeless tobacco: Never  Vaping Use   Vaping Use: Never used  Substance and Sexual Activity   Alcohol use: Not Currently   Drug use: Never   Sexual activity: Not Currently  Other Topics Concern   Not on file  Social History Narrative   Not on file   Social Determinants of Health   Financial Resource Strain: Not on file  Food Insecurity: Not on file  Transportation Needs: Not on file  Physical Activity: Not on file  Stress: Not on file  Social Connections: Not on file  Intimate Partner Violence: Not on file    Physical Exam: Today's Vitals   02/26/23 0659  BP: 93/64  Pulse: 64  Resp: 20  Temp: (!) 97.1 F (36.2 C)  TempSrc: Tympanic  SpO2: 96%  Weight: 82.1 kg  Height: 5\' 5"  (1.651 m)  PainSc: 0-No pain   Body mass index is 30.12 kg/m. GEN: NAD EYE: Sclerae anicteric ENT: MMM CV: Non-tachycardic GI: Soft, TTP in MEG (4-5/10 pain currently preprocedure) NEURO:  Alert & Oriented x 3  Lab Results: No results for input(s): "WBC", "HGB", "HCT", "PLT" in the last 72 hours. BMET No results for input(s): "NA", "K", "CL", "CO2", "GLUCOSE", "BUN", "CREATININE", "CALCIUM" in the last 72 hours. LFT No results for input(s): "PROT", "ALBUMIN", "AST", "ALT", "ALKPHOS", "BILITOT", "BILIDIR", "IBILI" in the last 72 hours. PT/INR No results for input(s): "LABPROT", "INR" in the last 72 hours.   Impression / Plan: This is a 62 y.o.female who presents for EGD/EUS to evaluate biliary ductal dilation and abdominal pain.  The risks of an EUS including intestinal perforation, bleeding, infection, aspiration, and medication effects were discussed as was the possibility it may not give a definitive diagnosis if a biopsy is performed.  When a biopsy of the pancreas is done as part of the EUS, there is an additional risk of pancreatitis at the rate of about 1-2%.  It was explained that procedure related  pancreatitis is typically mild, although it can be severe and even life threatening, which is why we do not perform random pancreatic biopsies and only biopsy a lesion/area we feel is concerning enough to warrant the risk.   The risks and benefits of endoscopic evaluation/treatment were discussed with the patient and/or family; these include but are not limited to the risk of perforation, infection, bleeding, missed lesions, lack of diagnosis, severe illness requiring hospitalization, as well as anesthesia and sedation related illnesses.  The patient's history has been reviewed, patient examined, no change in status, and deemed stable for  procedure.  The patient and/or family is agreeable to proceed.    Justice Britain, MD [facility] Office # [phone]

## 2023-02-26 NOTE — Op Note (Signed)
[facility] Patient Name: Melissa Conley Procedure Date: 02/26/2023 MRN: [id number] Attending MD: Corliss Parish , MD, [id number] Date of Birth: 1961-01-01 CSN: [id number] Age: 62 Admit Type: Outpatient Procedure:                Upper EUS Indications:              Common bile duct dilation (acquired) seen on MRCP,                            Epigastric abdominal pain, Personal history of                            malignant gastric neoplasm Providers:                Corliss Parish, MD, Adolph Pollack, RN,                            Martha Clan, RN, Kandice Robinsons, Technician Referring MD:             Dub Amis. Tomasa Rand, MD, Leighton Roach. Truett Perna, Sami                            [place] Medicines:                Monitored Anesthesia Care Complications:            No immediate complications. Estimated Blood Loss:     Estimated blood loss was minimal. Procedure:                Pre-Anesthesia Assessment:                           - Prior to the procedure, a History and Physical                            was performed, and patient medications and                            allergies were reviewed. The patient's tolerance of                            previous anesthesia was also reviewed. The risks                            and benefits of the procedure and the sedation                            options and risks were discussed with the patient.                            All questions were answered, and informed consent                            was obtained. Prior Anticoagulants: The patient has  taken no anticoagulant or antiplatelet agents                            except for aspirin. ASA Grade Assessment: II - A                            patient with mild systemic disease. After reviewing                            the risks and benefits, the patient was deemed in                            satisfactory condition to undergo the  procedure.                           After obtaining informed consent, the endoscope was                            passed under direct vision. Throughout the                            procedure, the patient's blood pressure, pulse, and                            oxygen saturations were monitored continuously. The                            [id number]-[id number] ([id number]) Olympus endoscope was introduced                            through the mouth, and advanced to the second part                            of duodenum. The [id number] ([id number]) Olympus                            duodenoscope was introduced through the mouth, and                            advanced to the area of papilla. The Linear GF-                            [id number] ) was introduced through the                            mouth, and advanced to the duodenum for ultrasound                            examination from the stomach and duodenum. The                            upper EUS was accomplished without difficulty. The  patient tolerated the procedure. Scope In: Scope Out: Findings:      ENDOSCOPIC FINDING: :      No gross lesions were noted in the entire esophagus.      The Z-line was regular and was found 36 cm from the incisors.      A 1 cm hiatal hernia was present.      Localized atrophic mucosa was found on the anterior wall of the gastric       body. Biopsies were taken with a cold forceps for histology.      No other gross lesions were noted in the entire examined stomach.      No gross lesions were noted in the duodenal bulb, in the first portion       of the duodenum and in the second portion of the duodenum.      The major papilla was normal.      ENDOSONOGRAPHIC FINDING: :      There was dilation in the common bile duct (4.7 mm -> 10.8 mm) and in       the common hepatic duct (11.1 mm). No evidence of any stones. Normal       contour.      Pancreatic parenchymal abnormalities  were noted in the entire pancreas.       These consisted of hyperechoic strands.      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      The pancreatic duct in the pancreatic head towards the major papilla       measured 1.1 mm and the pancreatic duct to the minor papilla measured       3.1 mm, genu of the pancreas measured 3.7 mm -> 2.6 mm, body of the       pancreas measured 2.3 mm and tail of the pancreas measured 1.7 mm -> 0.9       mm. Normal contour throughout without ectasia. As noted above incomplete       divisum.      Endosonographic imaging of the ampulla showed no intramural       (subepithelial) lesion.      Endosonographic imaging in the entire examined liver showed no mass.      No malignant-appearing lymph nodes were visualized in the celiac region       (level 20), peripancreatic region and porta hepatis region.      The celiac region was visualized. Impression:               EGD impression:                           - No gross lesions in the entire esophagus. Z-line                            regular, 36 cm from the incisors.                           - 1 cm hiatal hernia.                           - Gastric mucosal atrophy noted on the anterior                            wall of the gastric body. Biopsied.                           -  No other gross lesions in the entire stomach.                           - No gross lesions in the duodenal bulb, in the                            first portion of the duodenum and in the second                            portion of the duodenum.                           - Normal major papilla.                           EUS impression:                           - There was dilation in the common bile duct and in                            the common hepatic duct. No evidence of any stones                            with normal contour throughout.                           - Pancreatic parenchymal abnormalities consisting                             of hyperechoic strands were noted in the entire                            pancreas.                           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                            The pancreatic duct was noted within the head going                            towards the major papilla and minor papilla (type                            III pancreas divisum); normal appearance of the                            pancreatic duct within the genu of the pancreas,                            body of the pancreas and tail of the pancreas  otherwise.                           - No malignant-appearing lymph nodes were                            visualized in the celiac region (level 20),                            peripancreatic region and porta hepatis region. Moderate Sedation:      Not Applicable - Patient had care per Anesthesia. Recommendation:           - The patient will be observed post-procedure,                            until all discharge criteria are met.                           - Discharge patient to home.                           - Patient has a contact number available for                            emergencies. The signs and symptoms of potential                            delayed complications were discussed with the                            patient. Return to normal activities tomorrow.                            Written discharge instructions were provided to the                            patient.                           - Resume previous diet.                           - Observe patient's clinical course.                           - Continue present medications.                           - Await path results.                           - Unless the patient has evidence of documented                            episodes of pancreatitis, it is not clear that her  pain is associated from the pancreas divisum  nor                            would pancreatic ERCP be recommended at this time.                           - The findings and recommendations were discussed                            with the patient.                           - The findings and recommendations were discussed                            with the designated responsible adult. Procedure Code(s):        --- Professional ---                           [phone], Esophagogastroduodenoscopy, flexible,                            transoral; with endoscopic ultrasound examination                            limited to the esophagus, stomach or duodenum, and                            adjacent structures                           43239, Esophagogastroduodenoscopy, flexible,                            transoral; with biopsy, single or multiple Diagnosis Code(s):        --- Professional ---                           K44.9, Diaphragmatic hernia without obstruction or                            gangrene                           K31.89, Other diseases of stomach and duodenum                           K83.8, Other specified diseases of biliary tract                           K86.9, Disease of pancreas, unspecified                           Q45.3, Other congenital malformations of pancreas                            and  pancreatic duct                           I89.9, Noninfective disorder of lymphatic vessels                            and lymph nodes, unspecified                           R10.13, Epigastric pain                           Z85.028, Personal history of other malignant                            neoplasm of stomach                           R93.3, Abnormal findings on diagnostic imaging of                            other parts of digestive tract CPT copyright 2022 American [employer] Association. All rights reserved. The codes documented in this report are preliminary and upon coder review may  be revised to meet current  compliance requirements. Corliss Parish, MD 02/26/2023 8:48:33 AM Number of Addenda: 0

## 2023-02-26 NOTE — Progress Notes (Addendum)
Patient evaluated in the postprocedural recovery area. Patient having significant mount of nausea.  She is already received 8 mg of Zofran. She will be administered further antiemetic as per our anesthesia team (Amulsipride). Decision to give Compazine (family states that she gets a headache with extended Zofran use) as needed for 5 days in addition to Phenergan. We will reevaluate the patient for discharge later today.   Corliss Parish, MD [facility] Office # [phone]

## 2023-02-26 NOTE — Anesthesia Postprocedure Evaluation (Signed)
Anesthesia Post Note  Patient: Melissa Conley  Procedure(s) Performed: UPPER ENDOSCOPIC ULTRASOUND (EUS) RADIAL BIOPSY ESOPHAGOGASTRODUODENOSCOPY (EGD)     Patient location during evaluation: PACU Anesthesia Type: General Level of consciousness: awake and alert Pain management: pain level controlled Vital Signs Assessment: post-procedure vital signs reviewed and stable Respiratory status: spontaneous breathing, nonlabored ventilation, respiratory function stable and patient connected to nasal cannula oxygen Cardiovascular status: blood pressure returned to baseline and stable Postop Assessment: no apparent nausea or vomiting Anesthetic complications: no   No notable events documented.  Last Vitals:  Vitals:   02/26/23 0920 02/26/23 0930  BP: (!) 100/52 101/67  Pulse: (!) 57 (!) 59  Resp: 10 12  Temp:    SpO2: 95% 98%    Last Pain:  Vitals:   02/26/23 0930  TempSrc:   PainSc: 3                  Melissa Conley      

## 2023-02-26 NOTE — Discharge Instructions (Signed)
YOU HAD AN ENDOSCOPIC PROCEDURE TODAY: Refer to the procedure report and other information in the discharge instructions given to you for any specific questions about what was found during the examination. If this information does not answer your questions, please call [place] office at [phone] to clarify.  ° °YOU SHOULD EXPECT: Some feelings of bloating in the abdomen. Passage of more gas than usual. Walking can help get rid of the air that was put into your GI tract during the procedure and reduce the bloating.  ° °DIET: Your first meal following the procedure should be a light meal and then it is ok to progress to your normal diet. A half-sandwich or bowl of soup is an example of a good first meal. Heavy or fried foods are harder to digest and may make you feel nauseous or bloated. Drink plenty of fluids but you should avoid alcoholic beverages for 24 hours. ° °ACTIVITY: Your care partner should take you home directly after the procedure. You should plan to take it easy, moving slowly for the rest of the day. You can resume normal activity the day after the procedure however YOU SHOULD NOT DRIVE, use power tools, machinery or perform tasks that involve climbing or major physical exertion for 24 hours (because of the sedation medicines used during the test).  ° °SYMPTOMS TO REPORT IMMEDIATELY: °A gastroenterologist can be reached at any hour. Please call [phone]  for any of the following symptoms:  ° °Following upper endoscopy (EGD, EUS, ERCP, esophageal dilation) °Vomiting of blood or coffee ground material  °New, significant abdominal pain  °New, significant chest pain or pain under the shoulder blades  °Painful or persistently difficult swallowing  °New shortness of breath  °Black, tarry-looking or red, bloody stools ° °FOLLOW UP:  °If any biopsies were taken you will be contacted by phone or by letter within the next 1-3 weeks. Call [phone]  if you have not heard about the biopsies in 3 weeks.    °Please also call with any specific questions about appointments or follow up tests. ° °

## 2023-02-26 NOTE — Progress Notes (Addendum)
Pt presented to endoscopy today for EGD/EUS with MD Mansouraty. Post-procedure, pt c/o nausea and began dry heaving. MDA Oddonno notified. VO for 10 mg Barhemsys IV push. Medication given as ordered (see MAR)   Eulas Post, RN 02/26/23 9:01 AM  Addendum 9147: Pt endorses relief in nausea post Barhemsys administration. Pt has stopped wretching at this time.  Addendum 0935: Pt denies further nausea. Pt states sore throat is now 3/10. MD Mansouraty notified. Per MD Mansouraty, okay for discharge.  Eulas Post, RN 02/26/23 9:35 AM

## 2023-02-26 NOTE — Anesthesia Postprocedure Evaluation (Signed)
Anesthesia Post Note  Patient: Melissa Conley  Procedure(s) Performed: UPPER ENDOSCOPIC ULTRASOUND (EUS) RADIAL BIOPSY ESOPHAGOGASTRODUODENOSCOPY (EGD)     Patient location during evaluation: PACU Anesthesia Type: General Level of consciousness: awake and alert Pain management: pain level controlled Vital Signs Assessment: post-procedure vital signs reviewed and stable Respiratory status: spontaneous breathing, nonlabored ventilation, respiratory function stable and patient connected to nasal cannula oxygen Cardiovascular status: blood pressure returned to baseline and stable Postop Assessment: no apparent nausea or vomiting Anesthetic complications: no   No notable events documented.  Last Vitals:  Vitals:   02/26/23 0920 02/26/23 0930  BP: (!) 100/52 101/67  Pulse: (!) 57 (!) 59  Resp: 10 12  Temp:    SpO2: 95% 98%    Last Pain:  Vitals:   02/26/23 0930  TempSrc:   PainSc: 3                  Omarion Minnehan

## 2023-02-26 NOTE — Transfer of Care (Signed)
Immediate Anesthesia Transfer of Care Note  Patient: Melissa Conley  Procedure(s) Performed: UPPER ENDOSCOPIC ULTRASOUND (EUS) RADIAL BIOPSY ESOPHAGOGASTRODUODENOSCOPY (EGD)  Patient Location: PACU and Endoscopy Unit  Anesthesia Type:General  Level of Consciousness: awake, alert , and oriented  Airway & Oxygen Therapy: Patient Spontanous Breathing and Patient connected to nasal cannula oxygen  Post-op Assessment: Report given to RN and Post -op Vital signs reviewed and stable  Post vital signs: Reviewed and stable  Last Vitals:  Vitals Value Taken Time  BP 80/50 02/26/23 0844  Temp    Pulse 68 02/26/23 0844  Resp 14 02/26/23 0844  SpO2 96 % 02/26/23 0844    Last Pain:  Vitals:   02/26/23 0659  TempSrc: Tympanic  PainSc: 0-No pain         Complications: No notable events documented.

## 2023-02-27 ENCOUNTER — Encounter: Payer: Self-pay | Admitting: Gastroenterology

## 2023-02-27 LAB — SURGICAL PATHOLOGY

## 2023-02-28 ENCOUNTER — Encounter (HOSPITAL_COMMUNITY): Payer: Self-pay | Admitting: Gastroenterology

## 2023-03-19 ENCOUNTER — Telehealth: Payer: Self-pay | Admitting: *Deleted

## 2023-03-19 ENCOUNTER — Encounter: Payer: Self-pay | Admitting: *Deleted

## 2023-03-19 NOTE — Telephone Encounter (Signed)
-----   Message from Chrystie Nose, RN sent at 03/19/2023  7:52 AM EDT ----- Regarding: FW: Followup  ----- Message ----- From: Chrystie Nose, RN Sent: 03/19/2023  12:00 AM EDT To: Chrystie Nose, RN Subject: FW: Followup                                    ----- Message ----- From: Loretha Stapler, RN Sent: 02/26/2023  10:18 AM EDT To: Chrystie Nose, RN Subject: FW: Followup                                    ----- Message ----- From: Lemar Lofty., MD Sent: 02/26/2023  10:13 AM EDT To: Loretha Stapler, RN; Jenel Lucks, MD Subject: Followup                                       Patty, Please forward this to Dr. Milas Hock nurse.  Set up LFTs in 2 to 4 weeks.  Follow-up with patient in regards to her nausea symptoms.  SEC, EUS unremarkable.  She still having a lot of nausea.  Sent her home with some extra Compazine to try and get her through the next few days.  She certainly has incomplete pancreatic divisum but without episodes of documented pancreatitis I do not think this is playing any role in her symptoms.  1 area in the stomach that had some abnormality.  Biopsied.  Her bile duct does not show any stones.  Etiology of her abnormal LFTs may still be hepatic steatosis related.  Recommend consideration of liver biopsy if they still remain in elevation.  She probably needs updated LFTs with you in the next couple of weeks. Thanks. GM

## 2023-03-19 NOTE — Telephone Encounter (Signed)
Message sent to patient via MYChart to inform LFT's are due. Unable to reach patient via phone due to inability to leave vm.

## 2023-04-02 NOTE — Telephone Encounter (Signed)
Patient was called today and VM was left on phone to inform the patient that Hepatic function panel is due this week via Dr. Tomasa Rand. Messaged was sent to patient's MyChart on 03/19/23, message was not read, also was not able to leave a vm on 03/19/23. Called and was able to leave message today with noted DPR information in chart.

## 2023-04-03 NOTE — Telephone Encounter (Signed)
Inbound call from patient daughter returning phone call. Requesting a call back to discuss previous message. Please advise, thank you.

## 2023-04-04 NOTE — Telephone Encounter (Signed)
Called patient to inform of the hepatic liver function panel due via Dr. Tomasa Rand. Patient understood and agreed.

## 2023-05-03 ENCOUNTER — Other Ambulatory Visit (INDEPENDENT_AMBULATORY_CARE_PROVIDER_SITE_OTHER): Payer: 59

## 2023-05-03 DIAGNOSIS — K760 Fatty (change of) liver, not elsewhere classified: Secondary | ICD-10-CM

## 2023-05-03 DIAGNOSIS — R7989 Other specified abnormal findings of blood chemistry: Secondary | ICD-10-CM | POA: Diagnosis not present

## 2023-05-03 DIAGNOSIS — C884 Extranodal marginal zone B-cell lymphoma of mucosa-associated lymphoid tissue [MALT-lymphoma]: Secondary | ICD-10-CM

## 2023-05-03 LAB — COMPREHENSIVE METABOLIC PANEL
ALT: 24 U/L (ref 0–35)
AST: 25 U/L (ref 0–37)
Albumin: 3.7 g/dL (ref 3.5–5.2)
Alkaline Phosphatase: 105 U/L (ref 39–117)
BUN: 9 mg/dL (ref 6–23)
CO2: 29 mEq/L (ref 19–32)
Calcium: 8.7 mg/dL (ref 8.4–10.5)
Chloride: 104 mEq/L (ref 96–112)
Creatinine, Ser: 0.67 mg/dL (ref 0.40–1.20)
GFR: 95.29 mL/min (ref >60.00)
Glucose, Bld: 99 mg/dL (ref 70–99)
Potassium: 4 mEq/L (ref 3.5–5.1)
Sodium: 139 mEq/L (ref 135–145)
Total Bilirubin: 0.3 mg/dL (ref 0.2–1.2)
Total Protein: 6.6 g/dL (ref 6.0–8.3)

## 2023-05-03 LAB — HEPATIC FUNCTION PANEL
ALT: 24 U/L (ref 0–35)
AST: 25 U/L (ref 0–37)
Albumin: 3.7 g/dL (ref 3.5–5.2)
Alkaline Phosphatase: 105 U/L (ref 39–117)
Bilirubin, Direct: 0.1 mg/dL (ref 0.0–0.3)
Total Bilirubin: 0.3 mg/dL (ref 0.2–1.2)
Total Protein: 6.6 g/dL (ref 6.0–8.3)

## 2023-05-07 NOTE — Progress Notes (Signed)
Ms. Marko,  Your liver enzymes have normalized.  No further work up is recommended at this time.  Recommend periodic  monitoring of liver enzymes with PCP.

## 2023-05-09 ENCOUNTER — Telehealth: Payer: Self-pay | Admitting: Gastroenterology

## 2023-05-09 NOTE — Telephone Encounter (Signed)
Pts daughter states pt is still having issues with nausea and vomiting along with abd pain. Requesting an appt. Pt scheduled to see Mike Gip PA 05/17/23 at 10am, she is aware of appt.

## 2023-05-09 NOTE — Telephone Encounter (Signed)
Inbound call from patients daughter stating that patient has been having abdominal pain and vomiting. Daughter stated that patient will vomit after she eats and sometimes will vomit when she has not ate at all. Patients daughter is requesting a call to discuss. Please advise.

## 2023-05-17 ENCOUNTER — Ambulatory Visit: Payer: 59 | Admitting: Physician Assistant

## 2023-08-13 ENCOUNTER — Ambulatory Visit: Payer: 59 | Admitting: Physician Assistant

## 2023-10-25 ENCOUNTER — Other Ambulatory Visit: Payer: Self-pay

## 2023-10-25 ENCOUNTER — Other Ambulatory Visit: Payer: Self-pay | Admitting: Gastroenterology

## 2023-10-25 MED ORDER — LUBIPROSTONE 24 MCG PO CAPS: 24.0000 ug | ORAL_CAPSULE | Freq: Two times a day (BID) | ORAL | 3 refills | Status: DC

## 2023-10-26 ENCOUNTER — Ambulatory Visit: Payer: 59 | Admitting: Physician Assistant

## 2023-10-26 ENCOUNTER — Ambulatory Visit: Payer: 59 | Admitting: Gastroenterology

## 2023-11-02 ENCOUNTER — Ambulatory Visit: Payer: 59 | Admitting: Gastroenterology

## 2023-11-02 NOTE — Progress Notes (Deleted)
 HPI :     Oncology History Overview Note (borrowed from Dr. 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 Truett Perna ([facility]) Gastroenterologist: Dr. Tomasa Rand [facility]) Radiation Oncologist: Dr. Kizzie Bane  Papillary thyroid carcinoma [facility])  02/03/2021 Biopsy  Left neck LN: thyroid tissue with features consistent with papillary thyroid carcinoma  05/10/2021 Surgery  Total thyroidectomy, bilateral SND: papillary thyroid carcinoma involving the left lobe (right lobe uninvolved), 0.9 cm, AI-, LI-, ETE-, R0 (<1 mm), 1/7 LN involved (3.4 cm, level 6, ENE+), pT1aN1a  Gastric lymphoma (HCC)  01/2022 Initial Diagnosis  Presented with upper abdominal pain  02/08/2022 Biopsy  EGD: atypical lamina propria infiltrate with focal lymphoepithelial lesions most consistent with a 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 Pylori negative; positive for CD20, Bcl2; negative for CD10, CD23, Bcl6, CD5, Cyclin D1; Ki-67 5%; B-cell clonality positive. Colonoscopy: 10 mm cecal tubular adenoma, small TA in transverse and descending colon, multipls small sigmoid hyperplastic polyps  03/08/2022 - Imaging  CT CAP W: no pathologic adenopathy within the C/A/P; stable nodularity within the superior aspect of RML likely postinflammatory scarring; moderate intra- and extrahepatic biliary ductal dilation  06/22/2022 - 07/20/2022 Radiation Therapy   EGD Feb 2024 - The examined portions of the nasopharynx, oropharynx and larynx were normal.  - Normal esophagus.  - Abnormal appearing focal ulcerated mucosa in the anterior wall of the gastric body and greater curvature of the gastric body. Biopsied.  - Focal ulcerated mucosa in the gastric body. Biopsied.  - Erythematous mucosa in the  stomach. Biopsied. 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  - Normal examined duodenum.  - Findings are suggestive of persistent MALT, possibly with progression   1. Surgical [P], gastric antrum ANTRAL MUCOSA WITH CHANGES OF REACTIVE GASTROPATHY. NEGATIVE FOR HELICOBACTER PYLORI. NEGATIVE FOR ATYPICAL LYMPHOID INFILTRATES. 2. 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 NEGATIVE FOR HELICOBACTER PYLORI. NEGATIVE FOR ATYPICAL LYMPHOID INFILTRATES. 3. Surgical [P], focal ulceration of gastric body ANTRAL AND OXYNTIC MUCOSA WITH SLIGHT CHRONIC INFLAMMATION UREMIA. NEGATIVE FOR HELICOBACTER PYLORI. NEGATIVE FOR ATYPICAL LYMPHOID INFILTRATES.  RUQUS Oct 17, 2022 IMPRESSION: 1. The common bile duct measures 15.9 mm which is more prominent than typically seen after cholecystectomy. Additionally, the caliber of the dilated common bile duct has progressively worsened as described above. Recommend correlation with LFTs. Consider MRCP or ERCP for better evaluation. 2. Hepatic steatosis.  MRCP Oct 29, 2022 IMPRESSION: 1. Unchanged intra and extrahepatic biliary ductal dilatation status post cholecystectomy, common bile duct measuring up to 1.3 cm in caliber. No obstructing calculus or other abnormality identified to the ampulla. This is not meaningfully changed compared to prior examinations, including MR dated 11/21/2021 and of doubtful clinical significance in the absence of clinical biochemical evidence of cholestasis. 2. Incidental note of pancreas divisum   EUS February 26, 2023 (Dr. Meridee Score)  EGD impression   - No gross lesions in the entire esophagus. Z-line regular, 36 cm from the incisors.   - 1 cm hiatal hernia.  - Gastric mucosal atrophy noted on the anterior wall of the gastric body. Biopsied.  - No other gross lesions in the entire stomach.  - No gross lesions in the duodenal bulb, in the first portion of the duodenum and in the second portion of  the duodenum.  - Normal major papilla.  FINAL MICROSCOPIC DIAGNOSIS:  A. STOMACH, ANTERIOR BODY, BIOPSY: - Oxyntic mucosa with minimal chronic inflammation and focal, mild proton pump inhibitor type effect/early fundic gland polyp like change - Negative for Helicobacter organisms on HE stain - Negative for intestinal metaplasia, dysplasia or malignancy     EUS impression: - There was dilation in the common bile duct and in the common hepatic duct. No evidence of any stones with normal contour throughout. - Pancreatic parenchymal abnormalities consisting of hyperechoic strands were noted in the entire pancreas. 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  The pancreatic duct was noted within the head going towards the major papilla and minor papilla (type III pancreas divisum); normal appearance of the pancreatic duct within the genu of the pancreas, body of the pancreas and tail of the pancreas otherwise. - No malignant-appearing lymph nodes were visualized in the celiac region (level 20), peripancreatic region and porta hepatis region.  Past Medical History:  Diagnosis Date   Angina at rest    Anxiety    hx (03/26/2018)   Bulging lumbar disc 03/28/2016   Overview:  L4-5, left   Chronic bronchitis (HCC)    Chronic lower back pain    Chronic pain syndrome 11/02/2015   Common migraine    "monthly" (03/26/2018)   Dysuria 01/31/2017   Fall from high place 03/25/2018   "deck; fell ~ 80ft; broke both feet"   Frequent headaches    "qod" (03/26/2018)   GERD without esophagitis 01/28/2018   History of abnormal mammogram 12/20/2016   IBS (irritable bowel syndrome)    Lumbar radiculopathy 11/02/2015   Menopausal disorder 01/05/2016   Mucosa-associated lymphoid tissue (MALT) lymphoma (HCC) 2023   Neck pain 11/02/2015   Numbness and tingling of both legs below knees 02/10/2016   Ovarian cancer (HCC) 1990   Pneumonia    "twice" (03/26/2018)   Postmenopausal HRT (hormone replacement  therapy) 01/31/2017   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/19/2016   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    Thoracic back pain 11/02/2015   Thyroid cancer (HCC) 2022   Unstable angina (HCC) 01/28/2018     Past Surgical History:  Procedure Laterality Date   BACK SURGERY     BIOPSY  02/26/2023   Procedure: BIOPSY;  Surgeon: Lemar Lofty., MD;  Location: [facility];  Service: Gastroenterology;;   CARDIAC CATHETERIZATION     CHOLECYSTECTOMY OPEN     DILATION AND CURETTAGE OF UTERUS     ESOPHAGOGASTRODUODENOSCOPY N/A 02/26/2023   Procedure: ESOPHAGOGASTRODUODENOSCOPY (EGD);  Surgeon: Lemar Lofty., MD;  Location: [facility];  Service: Gastroenterology;  Laterality: N/A;   EUS N/A 02/26/2023   Procedure: UPPER ENDOSCOPIC ULTRASOUND (EUS) RADIAL;  Surgeon: Lemar Lofty., MD;  Location: [facility];  Service: Gastroenterology;  Laterality: N/A;   ORIF ANKLE FRACTURE Right 03/27/2018   Procedure: OPEN REDUCTION INTERNAL FIXATION (ORIF) ANKLE FRACTURE;  Surgeon: Jodi Geralds, MD;  Location: [facility];  Service: Orthopedics;  Laterality: Right;   POSTERIOR LUMBAR FUSION  2000   SKIN CANCER EXCISION     eyelid/face   THYROIDECTOMY     TUBAL LIGATION     VAGINAL HYSTERECTOMY     Family History  Problem Relation Age of Onset   Heart attack Mother    Lung cancer Mother    Lymphoma Mother    Colon polyps Mother    Heart disease Father    Ovarian cancer Sister    Brain cancer Sister  Diabetes Sister    Colon polyps Sister    Colon cancer Brother    Bone cancer Brother    Liver cancer Brother    Diabetes Brother    Colon polyps Brother    Irritable bowel syndrome Son    Diabetes Cousin    Irritable bowel syndrome Granddaughter    Esophageal cancer Neg Hx    Rectal cancer Neg Hx    Stomach cancer Neg Hx    Social History   Tobacco Use   Smoking status: Former    Current packs/day: 0.00    Average  packs/day: 1.5 packs/day for 30.0 years (45.0 ttl pk-yrs)    Types: Cigarettes    Start date: 43    Quit date: 2010    Years since quitting: 15.1   Smokeless tobacco: Never  Vaping Use   Vaping status: Never Used  Substance Use Topics   Alcohol use: Not Currently   Drug use: Never   Current Outpatient Medications  Medication Sig Dispense Refill   acetaminophen (TYLENOL) 500 MG tablet Take 500 mg by mouth every 6 (six) hours as needed for moderate pain.     albuterol (VENTOLIN HFA) 108 (90 Base) MCG/ACT inhaler Inhale 1 puff into the lungs 4 (four) times daily as needed for shortness of breath.      aspirin 81 MG EC tablet Take 81 mg by mouth daily.     EPINEPHrine 0.3 mg/0.3 mL IJ SOAJ injection Inject 0.3 mg into the muscle as needed for anaphylaxis.     estrogens, conjugated, (PREMARIN) 0.625 MG tablet Take 0.625 mg by mouth daily.     levothyroxine (SYNTHROID) 137 MCG tablet Take 137 mcg by mouth daily.     lubiprostone (AMITIZA) 24 MCG capsule Take 1 capsule (24 mcg total) by mouth 2 (two) times daily with a meal. 180 capsule 3   Multiple Vitamin (MULTIVITAMIN) tablet Take 1 tablet by mouth daily.     naloxone (NARCAN) nasal spray 4 mg/0.1 mL Place 1 spray into the nose as needed for opioid reversal.     omeprazole (PRILOSEC) 20 MG capsule Take 20 mg by mouth daily.     ondansetron (ZOFRAN) 8 MG tablet Take 1 tablet (8 mg total) by mouth every 8 (eight) hours as needed for nausea or vomiting. 42 tablet 1   oxyCODONE (ROXICODONE) 15 MG immediate release tablet Take 15 mg by mouth every 4 (four) hours as needed for pain.     polyethylene glycol (MIRALAX / GLYCOLAX) 17 g packet Take 17 g by mouth 3 (three) times daily.     prochlorperazine (COMPAZINE) 10 MG tablet Take 1 tablet (10 mg total) by mouth every 6 (six) hours as needed for nausea or vomiting. 15 tablet 0   Soft Lens Products (OPTI-FREE PUREMOIST) SOLN Place 1 drop into both eyes daily as needed (irritation).     No current  facility-administered medications for this visit.   Allergies  Allergen Reactions   Haemophilus B Polysaccharide Vaccine Hives    Could not breath, ended up being hospitalized  Could not breath, ended up being hospitalized  Could not breath, ended up being hospitalized   Influenza Virus Vaccine Hives    Could not breath, ended up being hospitalized   Penicillins Anaphylaxis and Hives    Has patient had a PCN reaction causing immediate rash, facial/tongue/throat swelling, SOB or lightheadedness with hypotension: Yes Has patient had a PCN reaction causing severe rash involving mucus membranes or skin necrosis: No Has patient had  a PCN reaction that required hospitalization: Yes Has patient had a PCN reaction occurring within the last 10 years: Yes If all of the above answers are "NO", then may proceed with Cephalosporin use.     Gabapentin Other (See Comments)    GI Upset     Tapentadol Nausea Only   Methocarbamol Nausea Only   Morphine Nausea And Vomiting   Tizanidine Nausea Only     Review of Systems: All systems reviewed and negative except where noted in HPI.    No results found.  Physical Exam: There were no vitals taken for this visit. Constitutional: Pleasant,well-developed, ***female in no acute distress. HEENT: Normocephalic and atraumatic. Conjunctivae are normal. No scleral icterus. Neck supple.  Cardiovascular: Normal rate, regular rhythm.  Pulmonary/chest: Effort normal and breath sounds normal. No wheezing, rales or rhonchi. Abdominal: Soft, nondistended, nontender. Bowel sounds active throughout. There are no masses palpable. No hepatomegaly. Extremities: no edema Lymphadenopathy: No cervical adenopathy noted. Neurological: Alert and oriented to person place and time. Skin: Skin is warm and dry. No rashes noted. Psychiatric: Normal mood and affect. Behavior is normal.  CBC    Component Value Date/Time   WBC 7.5 03/28/2022 1532   WBC 8.4 03/27/2018  2029   RBC 4.66 03/28/2022 1532   HGB 13.2 03/28/2022 1532   HGB 12.9 02/06/2018 1110   HCT 41.4 03/28/2022 1532   HCT 39.8 02/06/2018 1110   PLT 322 03/28/2022 1532   PLT 359 02/06/2018 1110   MCV 88.8 03/28/2022 1532   MCV 89 02/06/2018 1110   MCH 28.3 03/28/2022 1532   MCHC 31.9 03/28/2022 1532   RDW 12.4 03/28/2022 1532   RDW 13.3 02/06/2018 1110   LYMPHSABS 2.5 03/28/2022 1532   MONOABS 0.4 03/28/2022 1532   EOSABS 0.2 03/28/2022 1532   BASOSABS 0.1 03/28/2022 1532    CMP     Component Value Date/Time   NA 139 05/03/2023 1115   NA 139 07/20/2021 0925   K 4.0 05/03/2023 1115   CL 104 05/03/2023 1115   CO2 29 05/03/2023 1115   GLUCOSE 99 05/03/2023 1115   BUN 9 05/03/2023 1115   BUN 10 07/20/2021 0925   CREATININE 0.67 05/03/2023 1115   CREATININE 0.99 03/28/2022 1532   CALCIUM 8.7 05/03/2023 1115   PROT 6.6 05/03/2023 1115   PROT 6.6 05/03/2023 1115   PROT 7.3 02/06/2018 1110   ALBUMIN 3.7 05/03/2023 1115   ALBUMIN 3.7 05/03/2023 1115   ALBUMIN 4.5 02/06/2018 1110   AST 25 05/03/2023 1115   AST 25 05/03/2023 1115   AST 15 03/28/2022 1532   ALT 24 05/03/2023 1115   ALT 24 05/03/2023 1115   ALT 16 03/28/2022 1532   ALKPHOS 105 05/03/2023 1115   ALKPHOS 105 05/03/2023 1115   BILITOT 0.3 05/03/2023 1115   BILITOT 0.3 05/03/2023 1115   BILITOT 0.3 03/28/2022 1532   GFRNONAA >60 03/28/2022 1532   GFRAA >60 03/27/2018 2029       Latest Ref Rng & Units 03/28/2022    3:32 PM 01/18/2021   12:00 AM 03/27/2018    8:29 PM  CBC EXTENDED  WBC 4.0 - 10.5 K/uL 7.5  6.5  8.4   RBC 3.87 - 5.11 MIL/uL 4.66  4.4  3.93   Hemoglobin 12.0 - 15.0 g/dL 13.0  86.5  78.4   HCT 36.0 - 46.0 % 41.4  39  36.7   Platelets 150 - 400 K/uL 322  305  241   NEUT# 1.7 -  7.7 K/uL 4.3  3.64    Lymph# 0.7 - 4.0 K/uL 2.5         ASSESSMENT AND PLAN:  Quitman Livings, MD

## 2024-03-05 ENCOUNTER — Ambulatory Visit: Admitting: Nurse Practitioner

## 2024-05-01 ENCOUNTER — Ambulatory Visit (INDEPENDENT_AMBULATORY_CARE_PROVIDER_SITE_OTHER): Admitting: Nurse Practitioner

## 2024-05-01 ENCOUNTER — Other Ambulatory Visit (INDEPENDENT_AMBULATORY_CARE_PROVIDER_SITE_OTHER)

## 2024-05-01 ENCOUNTER — Encounter: Payer: Self-pay | Admitting: Nurse Practitioner

## 2024-05-01 VITALS — BP 98/68 | HR 70 | Ht 65.0 in | Wt 183.1 lb

## 2024-05-01 DIAGNOSIS — R1011 Right upper quadrant pain: Secondary | ICD-10-CM

## 2024-05-01 DIAGNOSIS — R932 Abnormal findings on diagnostic imaging of liver and biliary tract: Secondary | ICD-10-CM | POA: Diagnosis not present

## 2024-05-01 DIAGNOSIS — C884 Extranodal marginal zone b-cell lymphoma of mucosa-associated lymphoid tissue (malt-lymphoma) not having achieved remission: Secondary | ICD-10-CM | POA: Diagnosis not present

## 2024-05-01 DIAGNOSIS — K5900 Constipation, unspecified: Secondary | ICD-10-CM

## 2024-05-01 DIAGNOSIS — R112 Nausea with vomiting, unspecified: Secondary | ICD-10-CM | POA: Diagnosis not present

## 2024-05-01 LAB — CBC WITH DIFFERENTIAL/PLATELET
Basophils Absolute: 0.1 K/uL (ref 0.0–0.1)
Basophils Relative: 1.3 % (ref 0.0–3.0)
Eosinophils Absolute: 0.2 K/uL (ref 0.0–0.7)
Eosinophils Relative: 3.7 % (ref 0.0–5.0)
HCT: 40.8 % (ref 36.0–46.0)
Hemoglobin: 13.4 g/dL (ref 12.0–15.0)
Lymphocytes Relative: 25.8 % (ref 12.0–46.0)
Lymphs Abs: 1.2 K/uL (ref 0.7–4.0)
MCHC: 32.8 g/dL (ref 30.0–36.0)
MCV: 87.8 fl (ref 78.0–100.0)
Monocytes Absolute: 0.6 K/uL (ref 0.1–1.0)
Monocytes Relative: 13.4 % — ABNORMAL HIGH (ref 3.0–12.0)
Neutro Abs: 2.5 K/uL (ref 1.4–7.7)
Neutrophils Relative %: 55.8 % (ref 43.0–77.0)
Platelets: 313 K/uL (ref 150.0–400.0)
RBC: 4.64 Mil/uL (ref 3.87–5.11)
RDW: 12.7 % (ref 11.5–15.5)
WBC: 4.5 K/uL (ref 4.0–10.5)

## 2024-05-01 LAB — COMPREHENSIVE METABOLIC PANEL WITH GFR
ALT: 72 U/L — ABNORMAL HIGH (ref 0–35)
AST: 40 U/L — ABNORMAL HIGH (ref 0–37)
Albumin: 4.2 g/dL (ref 3.5–5.2)
Alkaline Phosphatase: 170 U/L — ABNORMAL HIGH (ref 39–117)
BUN: 12 mg/dL (ref 6–23)
CO2: 28 meq/L (ref 19–32)
Calcium: 9.3 mg/dL (ref 8.4–10.5)
Chloride: 103 meq/L (ref 96–112)
Creatinine, Ser: 0.71 mg/dL (ref 0.40–1.20)
GFR: 92.05 mL/min (ref >60.00)
Glucose, Bld: 106 mg/dL — ABNORMAL HIGH (ref 70–99)
Potassium: 4.3 meq/L (ref 3.5–5.1)
Sodium: 140 meq/L (ref 135–145)
Total Bilirubin: 0.6 mg/dL (ref 0.2–1.2)
Total Protein: 7.5 g/dL (ref 6.0–8.3)

## 2024-05-01 LAB — LIPASE: Lipase: 32 U/L (ref 11.0–59.0)

## 2024-05-01 MED ORDER — LINACLOTIDE 145 MCG PO CAPS: 145.0000 ug | ORAL_CAPSULE | Freq: Every day | ORAL | 3 refills | Status: AC

## 2024-05-01 NOTE — Patient Instructions (Addendum)
 Your provider has requested that you go to the basement level for lab work before leaving today. Press B on the elevator. The lab is located at the first door on the left as you exit the elevator.   We have sent the following medications to your pharmacy for you to pick up at your convenience: Linzess    We have given you samples of the following medication to take: Linzess  145 mcg- Take 1 capsule by mouth once daily.   Drink Ensure Clear-  1 bottle/Can once daily.   You will be contacted by [facility] in the next 2 days to arrange a MRI/MRCP  The number on your caller ID will be [phone], please answer when they call.  If you have not heard from them in 2 days please call [phone] to schedule.     You have been scheduled for an endoscopy. Please follow written instructions given to you at your visit today.  If you use inhalers (even only as needed), please bring them with you on the day of your procedure.  If you take any of the following medications, they will need to be adjusted prior to your procedure:   DO NOT TAKE 7 DAYS PRIOR TO TEST- Trulicity (dulaglutide) Ozempic, Wegovy (semaglutide) Mounjaro (tirzepatide) Bydureon Bcise (exanatide extended release)  DO NOT TAKE 1 DAY PRIOR TO YOUR TEST Rybelsus (semaglutide) Adlyxin (lixisenatide) Victoza (liraglutide) Byetta (exanatide) ___________________________________________________________________________

## 2024-05-01 NOTE — Progress Notes (Unsigned)
 05/01/2024 Melissa Conley [id number] December 07, 1960   Chief Complaint:  History of Present Illness: Melissa Conley is a 63 year old female with a past medical history of anxiety, chronic pain, COPD, thyroid cancer, hepatic steatosis, Gastric MALT lymphoma and colon polyps.  She is known by Dr. Stacia.  She underwent an upper endoscopy to evaluate upper abdominal pain, and was found to have a localized area of inflamed/eroded mucosa along the lesser curvature of distal body.  Biopsies were consistent with gastric MALT lymphoma, testing for H. pylori was negative. She underwent CTs of the chest, abdomen, and pelvis on 02/28/2022. There is stable peribronchial nodularity in the right middle lobe, no new focal pulmonary nodules. The spleen is unremarkable. No chest or abdominal/pelvic lymphadenopathy. Moderate intra and extrapelvic biliary duct dilatation, slightly progressive.  She was last seen in office by Dr. Stacia 09/28/2022 to schedule a repeat EGD as recommended by her radiation oncologist.  RUQ pain started 2 weeks ago, decreased appetite,  RUQ never completely went away, sharp pains comes and goes, 10 to 15 minutes, sometimes feels like a balloon blowing up in rib cage, occurs without eating.  Fairly constant nausea Vomits 2 to 3 times daily  if she eat. Vomits shortly after eating, does not eat meat  Past week water mellon, grapes, ice cream   Ensure clear  She lost 6 lbs past 2months A lot of night sweats,  recently restarted HRT, was off x 3 weeks,   She is passing a BM every 4 days, hard balls of stool.  She is taking Miralax twice daily   She took Linzess  for a few months, stopped working   Amitiza  24 mcg bid 10/2023  Omeprazole      Latest Ref Rng & Units 03/28/2022    3:32 PM 01/18/2021   12:00 AM 03/27/2018    8:29 PM  CBC  WBC 4.0 - 10.5 K/uL 7.5  6.5  8.4   Hemoglobin 12.0 - 15.0 g/dL 86.7  86.9  88.3   Hematocrit 36.0 - 46.0 % 41.4  39  36.7    Platelets 150 - 400 K/uL 322  305  241          Latest Ref Rng & Units 05/03/2023   11:15 AM 10/19/2022    4:09 PM 03/28/2022    3:32 PM  CMP  Glucose 70 - 99 mg/dL 99   898   BUN 6 - 23 mg/dL 9   13   Creatinine 9.59 - 1.20 mg/dL 9.32   9.00   Sodium 864 - 145 mEq/L 139   140   Potassium 3.5 - 5.1 mEq/L 4.0   4.6   Chloride 96 - 112 mEq/L 104   104   CO2 19 - 32 mEq/L 29   27   Calcium  8.4 - 10.5 mg/dL 8.7   9.8   Total Protein 6.0 - 8.3 g/dL 6.0 - 8.3 g/dL 6.6    6.6  7.1  7.1   Total Bilirubin 0.2 - 1.2 mg/dL 0.2 - 1.2 mg/dL 0.3    0.3  0.5  0.3   Alkaline Phos 39 - 117 U/L 39 - 117 U/L 105    105  186  78   AST 0 - 37 U/L 0 - 37 U/L 25    25  72  15   ALT 0 - 35 U/L 0 - 35 U/L 24    24  68  16    RADIOLOGY  STUDIES:    RUQ SONO 10/17/2022:   COMPARISON:  CT scan of the chest, abdomen, and pelvis March 08, 2022   FINDINGS: Gallbladder: Surgically absent   Common bile duct: Diameter: Measures 15.9 mm today versus 12 mm on the CT scan of the chest, abdomen, and pelvis dated March 08, 2022 and 9 mm on the MRI of the abdomen November 21, 2021.   Liver: Diffuse increased echogenicity throughout the liver. No liver mass. Portal vein is patent on color Doppler imaging with normal direction of blood flow towards the liver.   Other: Intrahepatic ductal dilatation.   IMPRESSION: 1. The common bile duct measures 15.9 mm which is more prominent than typically seen after cholecystectomy. Additionally, the caliber of the dilated common bile duct has progressively worsened as described above. Recommend correlation with LFTs. Consider MRCP or ERCP for better evaluation. 2. Hepatic steatosis.  ABDOMINAL MRI/MRCP WITH AND WITHOUT CONTRAST 10/27/2022: COMPARISON:  Abdominal ultrasound, 10/17/2022, CT chest abdomen pelvis, 03/08/2022 MR abdomen, 11/21/2021   FINDINGS: Lower chest: No acute abnormality.   Hepatobiliary: No solid liver abnormality is seen. Status  post cholecystectomy. Unchanged intra and extrahepatic biliary ductal dilatation, common bile duct measuring up to 1.3 cm in caliber. No obstructing calculus or other abnormality identified to the ampulla.   Pancreas: Incidental note of pancreas divisum. No pancreatic ductal dilatation or surrounding inflammatory changes.   Spleen: Normal in size without significant abnormality.   Adrenals/Urinary Tract: Adrenal glands are unremarkable. Kidneys are normal, without renal calculi, solid lesion, or hydronephrosis.   Stomach/Bowel: Stomach is within normal limits. No evidence of bowel wall thickening, distention, or inflammatory changes. Moderate burden of stool in the included colon.   Vascular/Lymphatic: No significant vascular findings are present. No enlarged abdominal lymph nodes.   Other: No abdominal wall hernia or abnormality. No ascites.   Musculoskeletal: No acute or significant osseous findings.   IMPRESSION: 1. Unchanged intra and extrahepatic biliary ductal dilatation status post cholecystectomy, common bile duct measuring up to 1.3 cm in caliber. No obstructing calculus or other abnormality identified to the ampulla. This is not meaningfully changed compared to prior examinations, including MR dated 11/21/2021 and of doubtful clinical significance in the absence of clinical biochemical evidence of cholestasis. 2. Incidental note of pancreas divisum.    PAST GI PROCEDURES:  EGD 02/08/2022: - The examined portions of the nasopharynx, oropharynx and larynx were normal. - Normal esophagus.  - Ulcerated mucosa in the gastric body. Biopsied.  - Normal examined duodenum.  Colonoscopy 02/08/2022: - One 10 mm polyp in the cecum, removed with a cold snare. Resected and retrieved.  - One 3 mm polyp in the transverse colon, removed with a cold snare. Resected and retrieved.  - One 3 mm polyp in the descending colon, removed with a cold snare. Resected and retrieved.  - Many  1 to 4 mm polyps in the rectum, at the recto-sigmoid colon and in the sigmoid colon, removed with a cold snare. Resected and retrieved.  - The examined portion of the ileum was normal. - Recall colonoscopy 3 years   1. 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 - MOLECULAR TESTING TO CONFIRM B-CELL CLONALITY IS PENDING AND WILL BE REPORTED IN AN ADDENDUM. - SEE COMMENT. 2. Surgical [P], colon, descending, transverse, and cecum, polyp (3) - TUBULAR ADENOMA, FRAGMENTS. 3. Surgical [P], colon,  sigmoid, polyp (1) - HYPERPLASTIC POLYP.  EGD 10/19/2022: - The examined portions of the nasopharynx, oropharynx and larynx were normal.  - Normal esophagus.  - Abnormal appearing focal ulcerated mucosa in the anterior wall of the gastric body and greater curvature of the gastric body. Biopsied.  - Focal ulcerated mucosa in the gastric body. Biopsied.  - Erythematous mucosa in the stomach. Biopsied. 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  - Normal examined duodenum.  - Findings are suggestive of persistent MALT, possibly with progression. 1. Surgical [P], gastric antrum ANTRAL MUCOSA WITH CHANGES OF REACTIVE GASTROPATHY. NEGATIVE FOR HELICOBACTER PYLORI. NEGATIVE FOR ATYPICAL LYMPHOID INFILTRATES. 2. 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 NEGATIVE FOR HELICOBACTER PYLORI. NEGATIVE FOR ATYPICAL LYMPHOID INFILTRATES. 3. Surgical [P], focal ulceration of gastric body ANTRAL AND OXYNTIC MUCOSA WITH SLIGHT CHRONIC INFLAMMATION UREMIA. NEGATIVE FOR HELICOBACTER PYLORI. 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    EGD/EUS 02/26/2023 by Dr.  Wilhelmenia:  EGD impression: - No gross lesions in the entire esophagus. Z-line regular, 36 cm from the incisors.  - 1 cm hiatal hernia.  - Gastric mucosal atrophy noted on the anterior wall of the gastric body. Biopsied.  - No other gross lesions in the entire stomach.  - No gross lesions in the duodenal bulb, in the first portion of the duodenum and in the second portion of the duodenum. - Normal major papilla.  A. STOMACH, ANTERIOR BODY, BIOPSY:  - Oxyntic mucosa with minimal chronic inflammation and focal, mild  proton pump inhibitor type effect/early fundic gland polyp like change  - Negative for Helicobacter organisms on HE stain  - Negative for intestinal metaplasia, dysplasia or malignancy   EUS impression:  - There was dilation in the common bile duct and in the common hepatic duct. No evidence of any stones with normal contour throughout.  - Pancreatic parenchymal abnormalities consisting of hyperechoic strands were noted in the entire pancreas.  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 The pancreatic duct was noted within the head going towards the major papilla and minor papilla (type III pancreas divisum); normal appearance of the pancreatic duct within the genu of the pancreas, body of the pancreas and tail of the pancreas otherwise.  - No malignant-appearing lymph nodes were visualized in the celiac region (level 20), peripancreatic region and porta hepatis region.  Current Medications, Allergies, Past Medical History, Past Surgical History, Family History and Social History were reviewed in [employer] record.   Review of Systems:   Constitutional: Negative for fever, sweats, chills or weight loss.  Respiratory: Negative for shortness of breath.   Cardiovascular: Negative for chest pain, palpitations and leg swelling.  Gastrointestinal: See HPI.  Musculoskeletal: Negative for back pain or muscle aches.  Neurological: Negative for dizziness,  headaches or paresthesias.    Physical Exam: There were no vitals taken for this visit. General: in no acute distress. Head: Normocephalic and atraumatic. Eyes: No scleral icterus. Conjunctiva pink . Ears: Normal auditory acuity. Mouth: Dentition intact. No ulcers or lesions.  Lungs: Clear throughout to auscultation. Heart: Regular rate and rhythm, no murmur. Abdomen: Soft, nontender and nondistended. No masses or hepatomegaly. Normal bowel sounds x 4 quadrants.  Rectal: *** Musculoskeletal: Symmetrical with no gross deformities. Extremities: No edema. Neurological: Alert oriented x 4. No focal deficits.  Psychological: Alert and cooperative. Normal mood and affect  Assessment and  Recommendations: ***

## 2024-05-01 NOTE — Progress Notes (Signed)
 SABRA

## 2024-05-02 NOTE — Telephone Encounter (Signed)
 Received fax from pharmacy with refill request for Synthroid. Please refill if deemed appropriate. Pharmacy correct on file

## 2024-05-05 ENCOUNTER — Ambulatory Visit: Payer: Self-pay | Admitting: Nurse Practitioner

## 2024-05-05 NOTE — Telephone Encounter (Signed)
 GS triage received fax from [facility] requesting Levothyroxine 0.137mg  tablet refill. Last office visit 01/26/2022.  Please provide refill if deemed appropriate.

## 2024-05-05 NOTE — Progress Notes (Signed)
 orders

## 2024-05-05 NOTE — Telephone Encounter (Signed)
 CALLED X 1  LVM  advising to call back.

## 2024-05-05 NOTE — Telephone Encounter (Signed)
 Ilah Leavy Chihuahua, MD to Me (Selected Message)     05/05/24  7:00 AM She hasn't had labs or imaging in 2 years for her cancer and no showed her appointment last week.  Can you help her get in asap with me or cheryl so we can check labs and figure out what dose she needs?   Thanks,    Ilah   Coordinators-refer to note above and assist thanks

## 2024-05-07 NOTE — Progress Notes (Signed)
 Agree with the assessment and plan as outlined by Elida Shawl, NP.  No additional recommendations.  Patient's symptoms seem to be chronic in nature.  Suspect chronic biliary dilation may be related to chronic opioid use (which is also likely contributing to her GI symptoms).  Not unreasonable to repeat EGD to reassess gastric MALT status and make sure no significant change in biliary dilation  Melissa Kos E. Stacia, MD [facility]

## 2024-05-29 ENCOUNTER — Other Ambulatory Visit (HOSPITAL_BASED_OUTPATIENT_CLINIC_OR_DEPARTMENT_OTHER): Admitting: Radiology

## 2024-05-29 ENCOUNTER — Encounter: Payer: Self-pay | Admitting: Gastroenterology

## 2024-06-05 ENCOUNTER — Inpatient Hospital Stay (HOSPITAL_BASED_OUTPATIENT_CLINIC_OR_DEPARTMENT_OTHER): Admission: RE | Admit: 2024-06-05 | Source: Ambulatory Visit | Admitting: Radiology

## 2024-06-06 ENCOUNTER — Encounter: Admitting: Gastroenterology

## 2024-06-06 ENCOUNTER — Telehealth: Payer: Self-pay | Admitting: Gastroenterology

## 2024-06-06 NOTE — Telephone Encounter (Signed)
 Good Afternoon Dr.Cunningham,  Patient called stating she is going to have to cancel today's procedure at 1:30pm due to having a flat tire and she is coming from [place]. Patient stated she apologizes and will call back to reschedule procedure.   Please advise  Thank you

## 2024-06-19 ENCOUNTER — Ambulatory Visit (INDEPENDENT_AMBULATORY_CARE_PROVIDER_SITE_OTHER)
Admission: RE | Admit: 2024-06-19 | Discharge: 2024-06-19 | Disposition: A | Discharge status: Home / Self Care | Source: Ambulatory Visit | Attending: Nurse Practitioner | Admitting: Nurse Practitioner

## 2024-06-19 DIAGNOSIS — R932 Abnormal findings on diagnostic imaging of liver and biliary tract: Secondary | ICD-10-CM | POA: Diagnosis not present

## 2024-06-19 DIAGNOSIS — R1011 Right upper quadrant pain: Secondary | ICD-10-CM | POA: Diagnosis not present

## 2024-06-19 DIAGNOSIS — C884 Extranodal marginal zone b-cell lymphoma of mucosa-associated lymphoid tissue (malt-lymphoma) not having achieved remission: Secondary | ICD-10-CM | POA: Diagnosis not present

## 2024-06-19 MED ORDER — GADOBUTROL 1 MMOL/ML IV SOLN
8.3000 mL | Freq: Once | INTRAVENOUS | Status: AC | PRN
  Administered 2024-06-19: 8.3 mL via INTRAVENOUS

## 2024-07-03 ENCOUNTER — Other Ambulatory Visit

## 2024-07-03 ENCOUNTER — Encounter: Payer: Self-pay | Admitting: Gastroenterology

## 2024-07-03 ENCOUNTER — Ambulatory Visit: Admitting: Gastroenterology

## 2024-07-03 VITALS — BP 120/76 | HR 72 | Temp 97.3°F | Resp 16 | Ht 65.0 in | Wt 183.0 lb

## 2024-07-03 DIAGNOSIS — R112 Nausea with vomiting, unspecified: Secondary | ICD-10-CM

## 2024-07-03 DIAGNOSIS — C884 Extranodal marginal zone b-cell lymphoma of mucosa-associated lymphoid tissue (malt-lymphoma) not having achieved remission: Secondary | ICD-10-CM

## 2024-07-03 DIAGNOSIS — R1011 Right upper quadrant pain: Secondary | ICD-10-CM | POA: Diagnosis not present

## 2024-07-03 MED ORDER — SODIUM CHLORIDE 0.9 % IV SOLN: 500.0000 mL | INTRAVENOUS | Status: DC

## 2024-07-03 MED ORDER — METOCLOPRAMIDE HCL 5 MG PO TABS: 5.0000 mg | ORAL_TABLET | Freq: Four times a day (QID) | ORAL | 0 refills | Status: AC

## 2024-07-03 NOTE — Op Note (Signed)
 [facility] Patient Name: Melissa Conley Procedure Date: 07/03/2024 8:32 AM MRN: [id number] Endoscopist: Glendia E. Stacia , MD, [id number] Age: 63 Referring MD:  Date of Birth: 19-Mar-1961 Gender: Female Account #: [id number] Procedure:                Upper GI endoscopy Indications:              Abdominal pain in the right upper quadrant, Nausea                            with vomiting Medicines:                Monitored Anesthesia Care Procedure:                Pre-Anesthesia Assessment:                           - Prior to the procedure, a History and Physical                            was performed, and patient medications and                            allergies were reviewed. The patient's tolerance of                            previous anesthesia was also reviewed. The risks                            and benefits of the procedure and the sedation                            options and risks were discussed with the patient.                            All questions were answered, and informed consent                            was obtained. Prior Anticoagulants: The patient has                            taken no anticoagulant or antiplatelet agents. ASA                            Grade Assessment: III - A patient with severe                            systemic disease. After reviewing the risks and                            benefits, the patient was deemed in satisfactory                            condition to undergo the procedure.  After obtaining informed consent, the endoscope was                            passed under direct vision. Throughout the                            procedure, the patient's blood pressure, pulse, and                            oxygen saturations were monitored continuously. The                            GIF [id number] #[id number] was introduced through the                            mouth, and advanced to the second  part of duodenum.                            The upper GI endoscopy was accomplished without                            difficulty. The patient tolerated the procedure                            well. Scope In: Scope Out: Findings:                 The examined portions of the nasopharynx,                            oropharynx and larynx were normal.                           The examined esophagus was normal.                           A localized area of scarring (denuded mucosa) was                            found at the incisura. There was no erythema,                            friability or inflammatory changes in this area.                           The exam of the stomach was otherwise normal.                           Biopsies were taken with a cold forceps in the                            gastric body, at the incisura and in the gastric                            antrum for  Helicobacter pylori testing. Estimated                            blood loss was minimal.                           The examined duodenum was normal. Complications:            No immediate complications. Estimated Blood Loss:     Estimated blood loss was minimal. Impression:               - The examined portions of the nasopharynx,                            oropharynx and larynx were normal.                           - Normal esophagus.                           - Scarring in the incisura.                           - Normal examined duodenum.                           - Biopsies were taken with a cold forceps for                            Helicobacter pylori testing.                           - No obvious abnormalities to explain patient's                            symptoms. Suspect symptoms may be related to opioid                            effects on gastric motility. Recommendation:           - Patient has a contact number available for                            emergencies. The signs and symptoms  of potential                            delayed complications were discussed with the                            patient. Return to normal activities tomorrow.                            Written discharge instructions were provided to the                            patient.                           -  Resume previous diet.                           - Continue present medications.                           - Await pathology results.                           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 Gilbert Narain E. Stacia, MD 07/03/2024 8:59:46 AM This report has been signed electronically.

## 2024-07-03 NOTE — Patient Instructions (Addendum)
 YOU HAD AN ENDOSCOPIC PROCEDURE TODAY AT THE [facility]:   Refer to the procedure report that was given to you for any specific questions about what was found during the examination.  If the procedure report does not answer your questions, please call your gastroenterologist to clarify.  If you requested that your care partner not be given the details of your procedure findings, then the procedure report has been included in a sealed envelope for you to review at your convenience later.  YOU SHOULD EXPECT: Some feelings of bloating in the abdomen. Passage of more gas than usual.  Walking can help get rid of the air that was put into your GI tract during the procedure and reduce the bloating. If you had a lower endoscopy (such as a colonoscopy or flexible sigmoidoscopy) you may notice spotting of blood in your stool or on the toilet paper. If you underwent a bowel prep for your procedure, you may not have a normal bowel movement for a few days.  Please Note:  You might notice some irritation and congestion in your nose or some drainage.  This is from the oxygen used during your procedure.  There is no need for concern and it should clear up in a day or so.  SYMPTOMS TO REPORT IMMEDIATELY:  Following upper endoscopy (EGD)  Vomiting of blood or coffee ground material  New chest pain or pain under the shoulder blades  Painful or persistently difficult swallowing  New shortness of breath  Fever of 100F or higher  Black, tarry-looking stools  Resume previous diet Continue present medications Await pathology results Metaclopramide - 5 mg four times a day for 30 days    For urgent or emergent issues, a gastroenterologist can be reached at any hour by calling [phone]. Do not use MyChart messaging for urgent concerns.    DIET:  We do recommend a small meal at first, but then you may proceed to your regular diet.  Drink plenty of fluids but you should avoid alcoholic beverages  for 24 hours.  ACTIVITY:  You should plan to take it easy for the rest of today and you should NOT DRIVE or use heavy machinery until tomorrow (because of the sedation medicines used during the test).    FOLLOW UP: Our staff will call the number listed on your records the next business day following your procedure.  We will call around 7:15- 8:00 am to check on you and address any questions or concerns that you may have regarding the information given to you following your procedure. If we do not reach you, we will leave a message.     If any biopsies were taken you will be contacted by phone or by letter within the next 1-3 weeks.  Please call us  at [phone] if you have not heard about the biopsies in 3 weeks.    SIGNATURES/CONFIDENTIALITY: You and/or your care partner have signed paperwork which will be entered into your electronic medical record.  These signatures attest to the fact that that the information above on your After Visit Summary has been reviewed and is understood.  Full responsibility of the confidentiality of this discharge information lies with you and/or your care-partner.

## 2024-07-03 NOTE — Progress Notes (Signed)
 [facility] History and Physical   Primary Care Physician:  Norval Kettle, MD   Reason for Procedure:   Nausea, vomiting, RUQ pain/history of gastric MALT  Plan:    EGD     HPI: Melissa Conley is a 63 y.o. female undergoing EGD to evaluate persistent nausea/vomiting and RUQ pain.  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    Past Medical History:  Diagnosis Date   Angina at rest    Anxiety    hx (03/26/2018)   Bulging lumbar disc 03/28/2016   Overview:  L4-5, left   Chronic bronchitis (HCC)    Chronic lower back pain    Chronic pain syndrome 11/02/2015   Common migraine    monthly (03/26/2018)   Dysuria 01/31/2017   Fall from high place 03/25/2018   deck; fell ~ 48ft; broke both feet   Frequent headaches    qod (03/26/2018)   GERD without esophagitis 01/28/2018   History of abnormal mammogram 12/20/2016   IBS (irritable bowel syndrome)    Lumbar radiculopathy 11/02/2015   Menopausal disorder 01/05/2016   Mucosa-associated lymphoid tissue (MALT) lymphoma (HCC) 2023   Neck pain 11/02/2015   Numbness and tingling of both legs below knees 02/10/2016   Osteoporosis    Ovarian cancer (HCC) 1990   Pneumonia    twice (03/26/2018)   Postmenopausal HRT (hormone replacement therapy) 01/31/2017   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/19/2016   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    Thoracic back pain 11/02/2015   Thyroid cancer (HCC) 2022   Unstable angina (HCC) 01/28/2018    Past Surgical History:  Procedure Laterality Date   BACK SURGERY     BIOPSY  02/26/2023   Procedure: BIOPSY;  Surgeon: Wilhelmenia Aloha Raddle., MD;  Location: [facility];  Service: Gastroenterology;;   CARDIAC CATHETERIZATION     CHOLECYSTECTOMY OPEN     DILATION AND CURETTAGE OF UTERUS     ESOPHAGOGASTRODUODENOSCOPY N/A 02/26/2023   Procedure: ESOPHAGOGASTRODUODENOSCOPY (EGD);  Surgeon: Wilhelmenia Aloha Raddle., MD;  Location: [facility];  Service: Gastroenterology;  Laterality: N/A;   EUS N/A 02/26/2023   Procedure: UPPER ENDOSCOPIC ULTRASOUND (EUS) RADIAL;  Surgeon: Wilhelmenia Aloha Raddle., MD;  Location: [facility];  Service: Gastroenterology;  Laterality: N/A;   ORIF ANKLE FRACTURE Right 03/27/2018   Procedure: OPEN REDUCTION INTERNAL FIXATION (ORIF) ANKLE FRACTURE;  Surgeon: Yvone Rush, MD;  Location: [facility];  Service: Orthopedics;  Laterality: Right;   POSTERIOR LUMBAR FUSION  2000   SKIN CANCER EXCISION     eyelid/face   THYROIDECTOMY     TUBAL LIGATION     VAGINAL HYSTERECTOMY      Prior to Admission medications   Medication Sig Start Date End Date Taking? Authorizing Provider  aspirin 81 MG EC tablet Take 81 mg by mouth daily.   Yes [provider]  estrogens , conjugated, (PREMARIN ) 0.625 MG tablet Take 0.625 mg by mouth daily. Patient taking differently: Take 9 mg by mouth daily.   Yes [provider]  levothyroxine (SYNTHROID) 137 MCG tablet Take 137 mcg by mouth daily. 05/11/21  Yes [provider]  Multiple Vitamin (MULTIVITAMIN) tablet Take 1 tablet by mouth daily.   Yes [provider]  oxyCODONE  (ROXICODONE ) 15 MG immediate release tablet Take 15 mg by mouth every 4 (four) hours as needed for pain.   Yes [provider]  Soft Lens Products (OPTI-FREE PUREMOIST) SOLN Place  1 drop into both eyes daily as needed (irritation).   Yes [provider]  acetaminophen  (TYLENOL ) 500 MG tablet Take 500 mg by mouth every 6 (six) hours as needed for moderate pain.    [provider]  albuterol  (VENTOLIN  HFA) 108 (90 Base) MCG/ACT inhaler Inhale 1 puff into the lungs 4 (four) times daily as needed for shortness of breath.     [provider]  EPINEPHrine  0.3 mg/0.3 mL IJ SOAJ injection Inject 0.3 mg into the muscle as needed for anaphylaxis. 11/29/21   [provider]  linaclotide  (LINZESS ) 145 MCG CAPS  capsule Take 1 capsule (145 mcg total) by mouth daily before breakfast. 05/01/24 04/26/25  Kennedy-Smith, Colleen M, NP  naloxone [facility]) nasal spray 4 mg/0.1 mL Place 1 spray into the nose as needed for opioid reversal. 05/18/21   [provider]  omeprazole (PRILOSEC) 20 MG capsule Take 20 mg by mouth daily. 08/24/22   [provider]  polyethylene glycol (MIRALAX / GLYCOLAX) 17 g packet Take 17 g by mouth 3 (three) times daily.    [provider]  prochlorperazine  (COMPAZINE ) 10 MG tablet Take 1 tablet (10 mg total) by mouth every 6 (six) hours as needed for nausea or vomiting. 02/26/23   Mansouraty, Aloha Raddle., MD    Current Outpatient Medications  Medication Sig Dispense Refill   aspirin 81 MG EC tablet Take 81 mg by mouth daily.     estrogens , conjugated, (PREMARIN ) 0.625 MG tablet Take 0.625 mg by mouth daily. (Patient taking differently: Take 9 mg by mouth daily.)     levothyroxine (SYNTHROID) 137 MCG tablet Take 137 mcg by mouth daily.     Multiple Vitamin (MULTIVITAMIN) tablet Take 1 tablet by mouth daily.     oxyCODONE  (ROXICODONE ) 15 MG immediate release tablet Take 15 mg by mouth every 4 (four) hours as needed for pain.     Soft Lens Products (OPTI-FREE PUREMOIST) SOLN Place 1 drop into both eyes daily as needed (irritation).     acetaminophen  (TYLENOL ) 500 MG tablet Take 500 mg by mouth every 6 (six) hours as needed for moderate pain.     albuterol  (VENTOLIN  HFA) 108 (90 Base) MCG/ACT inhaler Inhale 1 puff into the lungs 4 (four) times daily as needed for shortness of breath.      EPINEPHrine  0.3 mg/0.3 mL IJ SOAJ injection Inject 0.3 mg into the muscle as needed for anaphylaxis.     linaclotide  (LINZESS ) 145 MCG CAPS capsule Take 1 capsule (145 mcg total) by mouth daily before breakfast. 30 capsule 3   naloxone (NARCAN) nasal spray 4 mg/0.1 mL Place 1 spray into the nose as needed for opioid reversal.     omeprazole (PRILOSEC) 20 MG capsule Take 20 mg by  mouth daily.     polyethylene glycol (MIRALAX / GLYCOLAX) 17 g packet Take 17 g by mouth 3 (three) times daily.     prochlorperazine  (COMPAZINE ) 10 MG tablet Take 1 tablet (10 mg total) by mouth every 6 (six) hours as needed for nausea or vomiting. 15 tablet 0   Current Facility-Administered Medications  Medication Dose Route Frequency Provider Last Rate Last Admin   0.9 %  sodium chloride  infusion  500 mL Intravenous Continuous Stacia Glendia BRAVO, MD        Allergies as of 07/03/2024 - Review Complete 07/03/2024  Allergen Reaction Noted   Haemophilus b polysaccharide vaccine Hives 08/23/2017   Influenza virus vaccine Hives 08/23/2017   Penicillins Anaphylaxis and Hives 11/04/2015  Gabapentin Other (See Comments) 02/10/2016   Methocarbamol Nausea Only 11/04/2015   Morphine Nausea And Vomiting 06/02/2016   Tapentadol Nausea Only 11/15/2016   Tizanidine  Nausea Only 11/04/2015    Family History  Problem Relation Age of Onset   Heart attack Mother    Lung cancer Mother    Lymphoma Mother    Colon polyps Mother    Heart disease Father    Ovarian cancer Sister    Brain cancer Sister    Diabetes Sister    Colon polyps Sister    Colon cancer Brother    Bone cancer Brother    Liver cancer Brother    Diabetes Brother    Colon polyps Brother    Irritable bowel syndrome Son    Diabetes Cousin    Irritable bowel syndrome Granddaughter    Esophageal cancer Neg Hx    Rectal cancer Neg Hx    Stomach cancer Neg Hx     Social History   Socioeconomic History   Marital status: Widowed    Spouse name: Not on file   Number of children: 3   Years of education: Not on file   Highest education level: 10th grade  Occupational History   Not on file  Tobacco Use   Smoking status: Former    Current packs/day: 0.00    Average packs/day: 1.5 packs/day for 30.0 years (45.0 ttl pk-yrs)    Types: Cigarettes    Start date: 27    Quit date: 2010    Years since quitting: 15.8    Smokeless tobacco: Never  Vaping Use   Vaping status: Never Used  Substance and Sexual Activity   Alcohol use: Not Currently   Drug use: Never   Sexual activity: Not Currently  Other Topics Concern   Not on file  Social History Narrative   Not on file   Social Drivers of Health   Financial Resource Strain: Not on file  Food Insecurity: Food Insecurity Present (02/23/2022)   Received from [facility]   Hunger Vital Sign    Within the past 12 months, you worried that your food would run out before you got the money to buy more.: Sometimes true    Within the past 12 months, the food you bought just didn't last and you didn't have money to get more.: Sometimes true  Transportation Needs: Not on file  Physical Activity: Not on file  Stress: Not on file  Social Connections: Unknown (01/09/2022)   [facility]   Social Network    Social Network: Not on file  Intimate Partner Violence: Unknown (12/14/2021)   Received from [facility]   HITS    Physically Hurt: Not on file    Insult or Talk Down To: Not on file    Threaten Physical Harm: Not on file    Scream or Curse: Not on file    Review of Systems:  All other review of systems negative except as mentioned in the HPI.  Physical Exam: Vital signs BP (!) 96/58   Pulse 64   Temp (!) 97.3 F (36.3 C) (Temporal)   Ht 5' 5 (1.651 m)   Wt 183 lb (83 kg)   SpO2 99%   BMI 30.45 kg/m   General:   Alert,  Well-developed, well-nourished, pleasant and cooperative in NAD Airway:  Mallampati 1 Lungs:  Clear throughout to auscultation.   Heart:  Regular rate and rhythm; no murmurs, clicks, rubs,  or gallops. Abdomen:  Soft, nontender  and nondistended. Normal bowel sounds.   Neuro/Psych:  Normal mood and affect. A and O x 3   Kimika Streater E. Stacia, MD [facility]

## 2024-07-03 NOTE — Progress Notes (Deleted)
 History and Physical Interval Note:  07/03/2024 8:30 AM  Melissa Conley  has presented today for endoscopic procedure(s), with the diagnosis of  Encounter Diagnoses  Name Primary?   MALT lymphoma (HCC) Yes   Nausea and vomiting, unspecified vomiting type   .  The various methods of evaluation and treatment have been discussed with the patient and/or family. After consideration of risks, benefits and other options for treatment, the patient has consented to  the endoscopic procedure(s).   The patient's history has been reviewed, patient examined, no change in status, stable for endoscopic procedure(s).  I have reviewed the patient's chart and labs.  Questions were answered to the patient's satisfaction.     Mateus Rewerts E. Stacia, MD [facility]

## 2024-07-03 NOTE — Progress Notes (Signed)
 To PACU via stretcher, sedated, good respiratory effort, VSS.

## 2024-07-03 NOTE — Progress Notes (Signed)
 Transferred to PACU via stretcher, arousing, VSS.

## 2024-07-03 NOTE — Progress Notes (Signed)
 Called to room to assist during endoscopic procedure.  Patient ID and intended procedure confirmed with present staff. Received instructions for my participation in the procedure from the performing physician.

## 2024-07-04 ENCOUNTER — Telehealth: Payer: Self-pay

## 2024-07-04 NOTE — Telephone Encounter (Signed)
  Follow up Call-     07/03/2024    8:03 AM 10/19/2022    2:31 PM 02/08/2022    2:44 PM  Call back number  Post procedure Call Back phone  # [phone]  Permission to leave phone message Yes Yes Yes     Patient questions:  Do you have a fever, pain , or abdominal swelling? No. Pain Score  0 *  Have you tolerated food without any problems? Yes.    Have you been able to return to your normal activities? Yes.    Do you have any questions about your discharge instructions: Diet   No. Medications  No. Follow up visit  No.  Do you have questions or concerns about your Care? No.  Actions: * If pain score is 4 or above: No action needed, pain <4.

## 2024-07-07 ENCOUNTER — Ambulatory Visit: Payer: Self-pay | Admitting: Gastroenterology

## 2024-07-07 LAB — ALPHA-GAL PANEL
Allergen, Mutton, f88: <0.1 kU/L
Allergen, Pork, f26: 0.29 kU/L — ABNORMAL HIGH
Beef: <0.1 kU/L
CLASS: 0
Class: 0
GALACTOSE-ALPHA-1,3-GALACTOSE IGE*: 0.31 kU/L — ABNORMAL HIGH (ref <0.10)

## 2024-07-07 LAB — SURGICAL PATHOLOGY

## 2024-07-07 LAB — INTERPRETATION:

## 2024-07-07 NOTE — Progress Notes (Signed)
 Ms. Stonehocker,  The biopsies of your stomach were unremarkable.  There was no evidence of recurrent lymphoma, no evidence of H.pylori infection and no significant inflammatory changes.  This is good news.  As discussed, your symptoms may be related to side effects from your pain medication.  Your recent alpha gal test was positive as well, so hopefully a more strict meat-free/dairy free diet may help as well

## 2024-07-07 NOTE — Progress Notes (Signed)
 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 I would strictly avoid all foods containing any beef or pork products, and would also suggest eliminating dairy for a while as well. Please follow up with us  in the office to go over this diagnosis more in depth.  Alan,  Please book routine follow up with me or Pod A app

## 2024-08-18 ENCOUNTER — Ambulatory Visit: Admitting: Nurse Practitioner

## 2024-08-18 NOTE — Progress Notes (Deleted)
 08/18/2024 Melissa Conley [id number] [birth year]   Chief Complaint:  History of Present Illness: Melissa Conley is a 63 year old female with a past medical history of anxiety, chronic pain, COPD, thyroid cancer status post thyroidectomy 01/2021, hepatic steatosis, Gastric MALT lymphoma and colon polyps.  She is known by Dr. Stacia.      Latest Ref Rng & Units 05/01/2024   11:57 AM 03/28/2022    3:32 PM 01/18/2021   12:00 AM  CBC  WBC 4.0 - 10.5 K/uL 4.5  7.5  6.5   Hemoglobin 12.0 - 15.0 g/dL 86.5  86.7  86.9   Hematocrit 36.0 - 46.0 % 40.8  41.4  39   Platelets 150.0 - 400.0 K/uL 313.0  322  305        Latest Ref Rng & Units 05/01/2024   11:57 AM 05/03/2023   11:15 AM 10/19/2022    4:09 PM  CMP  Glucose 70 - 99 mg/dL 893  99    BUN 6 - 23 mg/dL 12  9    Creatinine 9.59 - 1.20 mg/dL 9.28  9.32    Sodium 864 - 145 mEq/L 140  139    Potassium 3.5 - 5.1 mEq/L 4.3  4.0    Chloride 96 - 112 mEq/L 103  104    CO2 19 - 32 mEq/L 28  29    Calcium  8.4 - 10.5 mg/dL 9.3  8.7    Total Protein 6.0 - 8.3 g/dL 7.5  6.6    6.6  7.1   Total Bilirubin 0.2 - 1.2 mg/dL 0.6  0.3    0.3  0.5   Alkaline Phos 39 - 117 U/L 170  105    105  186   AST 0 - 37 U/L 40  25    25  72   ALT 0 - 35 U/L 72  24    24  68       RADIOLOGY STUDIES:    RUQ SONO 10/17/2022:   COMPARISON:  CT scan of the chest, abdomen, and pelvis March 08, 2022   FINDINGS: Gallbladder: Surgically absent   Common bile duct: Diameter: Measures 15.9 mm today versus 12 mm on the CT scan of the chest, abdomen, and pelvis dated March 08, 2022 and 9 mm on the MRI of the abdomen November 21, 2021.   Liver: Diffuse increased echogenicity throughout the liver. No liver mass. Portal vein is patent on color Doppler imaging with normal direction of blood flow towards the liver.   Other: Intrahepatic ductal dilatation.   IMPRESSION: 1. The common bile duct measures 15.9 mm which is more prominent than typically seen after  cholecystectomy. Additionally, the caliber of the dilated common bile duct has progressively worsened as described above. Recommend correlation with LFTs. Consider MRCP or ERCP for better evaluation. 2. Hepatic steatosis.   ABDOMINAL MRI/MRCP WITH AND WITHOUT CONTRAST 10/27/2022: COMPARISON:  Abdominal ultrasound, 10/17/2022, CT chest abdomen pelvis, 03/08/2022 MR abdomen, 11/21/2021   FINDINGS: Lower chest: No acute abnormality.   Hepatobiliary: No solid liver abnormality is seen. Status post cholecystectomy. Unchanged intra and extrahepatic biliary ductal dilatation, common bile duct measuring up to 1.3 cm in caliber. No obstructing calculus or other abnormality identified to the ampulla.   Pancreas: Incidental note of pancreas divisum. No pancreatic ductal dilatation or surrounding inflammatory changes.   Spleen: Normal in size without significant abnormality.   Adrenals/Urinary Tract: Adrenal glands are unremarkable. Kidneys are normal, without renal calculi,  solid lesion, or hydronephrosis.   Stomach/Bowel: Stomach is within normal limits. No evidence of bowel wall thickening, distention, or inflammatory changes. Moderate burden of stool in the included colon.   Vascular/Lymphatic: No significant vascular findings are present. No enlarged abdominal lymph nodes.   Other: No abdominal wall hernia or abnormality. No ascites.   Musculoskeletal: No acute or significant osseous findings.   IMPRESSION: 1. Unchanged intra and extrahepatic biliary ductal dilatation status post cholecystectomy, common bile duct measuring up to 1.3 cm in caliber. No obstructing calculus or other abnormality identified to the ampulla. This is not meaningfully changed compared to prior examinations, including MR dated 11/21/2021 and of doubtful clinical significance in the absence of clinical biochemical evidence of cholestasis. 2. Incidental note of pancreas divisum.   Abdominal MRI and MRCP  with and without contrast 06/19/2024: FINDINGS: Lower chest: Unremarkable MR appearance to the lung bases. No pleural effusion. No pericardial effusion. Normal heart size.   Hepatobiliary: The liver is mild-to-moderately enlarged in size with length of 17.7 cm. Noncirrhotic configuration. No focal liver lesion. There is moderate diffuse hepatic steatosis. There is mild central intrahepatic bile duct dilation. Extrahepatic bile duct is also dilated measuring up to 1.6 cm in the proximal portion 1.0 cm in the midportion and gradually tapers measuring up to 3-4 mm just before the ampulla of Vater. It appears essentially unchanged since the prior study, when remeasured in similar fashion. No obstructing mass or choledocholithiasis. Status post cholecystectomy.   Pancreas: Note is made of pancreatic divisum. There is normal T1 hyperintense appearance of the pancreas. No focal lesion. No peripancreatic fat stranding. Main pancreatic duct is not dilated.   Spleen: Size within normal limits. Redemonstration of a subcentimeter simple cyst arising from the posterior aspect.   Adrenals/Urinary Tract: Unremarkable adrenal glands. Mild fullness noted in the bilateral renal collecting systems without frank hydronephrosis. Bilateral extrarenal pelvis noted. No suspicious renal mass. There is a single subcentimeter sized partially exophytic cyst arising from the right kidney lower pole, anteriorly.   Stomach/Bowel: Visualized portions within the abdomen are unremarkable. No disproportionate dilation of bowel loops.   Vascular/Lymphatic: No pathologically enlarged lymph nodes identified. No abdominal aortic aneurysm demonstrated. No ascites.   Other:  None.   Musculoskeletal: No suspicious bone lesions identified.   IMPRESSION: 1. Redemonstration of mildly dilated intra and extrahepatic bile ducts, essentially unchanged since the prior study. No obstructing mass or choledocholithiasis. 2.  Pancreatic divisum. No imaging signs of acute pancreatitis. 3. Multiple other nonacute observations, as described above.       PAST GI PROCEDURES:   EGD 02/08/2022: - The examined portions of the nasopharynx, oropharynx and larynx were normal. - Normal esophagus.  - Ulcerated mucosa in the gastric body. Biopsied.  - Normal examined duodenum.   Colonoscopy 02/08/2022: - One 10 mm polyp in the cecum, removed with a cold snare. Resected and retrieved.  - One 3 mm polyp in the transverse colon, removed with a cold snare. Resected and retrieved.  - One 3 mm polyp in the descending colon, removed with a cold snare. Resected and retrieved.  - Many 1 to 4 mm polyps in the rectum, at the recto-sigmoid colon and in the sigmoid colon, removed with a cold snare. Resected and retrieved.  - The examined portion of the ileum was normal. - Recall colonoscopy 3 years    1. Surgical [P], gastric - ATYPICAL LAMINA PROPRIA B-CELL INFILTRATE WITH FOCAL LYMPHOEPITHELIAL LESIONS MOST CONSISTENT WITH A B-CELL LYMPHOPROLIFERATIVE DISORDER SUCH AS AN  EXTRANODAL MARGINAL ZONE LYMPHOMA OF MUCOSA ASSOCIATED LYMPHOID TISSUE (MALTOMA). - MOLECULAR TESTING TO CONFIRM B-CELL CLONALITY IS PENDING AND WILL BE REPORTED IN AN ADDENDUM. - SEE COMMENT. 2. Surgical [P], colon, descending, transverse, and cecum, polyp (3) - TUBULAR ADENOMA, FRAGMENTS. 3. Surgical [P], colon, sigmoid, polyp (1) - HYPERPLASTIC POLYP.   EGD 10/19/2022: - The examined portions of the nasopharynx, oropharynx and larynx were normal.  - Normal esophagus.  - Abnormal appearing focal ulcerated mucosa in the anterior wall of the gastric body and greater curvature of the gastric body. Biopsied.  - Focal ulcerated mucosa in the gastric body. Biopsied.  - Erythematous mucosa in the stomach. Biopsied. 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  - Normal examined duodenum.  - Findings are suggestive of persistent MALT, possibly with progression.   1. Surgical [P],  gastric antrum ANTRAL MUCOSA WITH CHANGES OF REACTIVE GASTROPATHY. NEGATIVE FOR HELICOBACTER PYLORI. NEGATIVE FOR ATYPICAL LYMPHOID INFILTRATES. 2. 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 NEGATIVE FOR HELICOBACTER PYLORI. NEGATIVE FOR ATYPICAL LYMPHOID INFILTRATES. 3. Surgical [P], focal ulceration of gastric body ANTRAL AND OXYNTIC MUCOSA WITH SLIGHT CHRONIC INFLAMMATION UREMIA. NEGATIVE FOR HELICOBACTER PYLORI. 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    EGD/EUS 02/26/2023 by Dr. Wilhelmenia:   EGD impression: - No gross lesions in the entire esophagus. Z-line regular, 36 cm from the incisors.  - 1 cm hiatal hernia.  - Gastric mucosal atrophy noted on the anterior wall of the gastric body. Biopsied.  - No other gross lesions in the entire stomach.  - No gross lesions in the duodenal bulb, in the first portion of the duodenum and in the second portion of the duodenum. - Normal major papilla.  A. STOMACH, ANTERIOR BODY, BIOPSY:  - Oxyntic mucosa with minimal chronic inflammation and focal, mild  proton pump inhibitor type effect/early fundic gland polyp like change  - Negative for Helicobacter organisms on HE stain  - Negative for intestinal metaplasia, dysplasia or malignancy    EUS impression:  - There was dilation in the common bile duct and in the common hepatic duct. No evidence of any stones with normal contour throughout.  - Pancreatic parenchymal abnormalities consisting of hyperechoic strands were noted in the entire pancreas.  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 The pancreatic duct was noted within the head going towards the major papilla and minor papilla (type III pancreas divisum); normal appearance of the pancreatic duct within the genu of the pancreas, body of the pancreas and tail of the  pancreas otherwise.  - No malignant-appearing lymph nodes were visualized in the celiac region (level 20), peripancreatic region and porta hepatis region.  EGD 07/03/2024: - The examined portions of the nasopharynx, oropharynx and larynx were normal.  - Normal esophagus.  - Scarring in the incisura.  - Normal examined duodenum.  - Biopsies were taken with a cold forceps for Helicobacter pylori testing.  - No obvious abnormalities to explain patient's symptoms. Suspect symptoms may be related to opioid effects on gastric motility.  1. Surgical [P], gastric antrum :      - BENIGN GASTRIC MUCOSA WITH NO SPECIFIC PATHOLOGIC CHANGE.      - NO SIGNIFICANT LYMPHOCYTIC INFILTRATE.      - NO EVIDENCE OF H. PYLORI ON H&E STAIN.       2. Surgical [P], gastric incisura :      -  BENIGN GASTRIC MUCOSA WITH NO SPECIFIC PATHOLOGIC CHANGE.      - NO SIGNIFICANT LYMPHOCYTIC INFILTRATE.      - NO EVIDENCE OF H. PYLORI ON H&E STAIN.       3. Surgical [P], gastric body :      - BENIGN GASTRIC MUCOSA WITH NO SPECIFIC PATHOLOGIC CHANGE.      - NO SIGNIFICANT LYMPHOCYTIC INFILTRATE.      - NO EVIDENCE OF H. PYLORI ON H&E STAIN.   he biopsies of your stomach were unremarkable. There was no evidence of recurrent lymphoma, no evidence of H.pylori infection and no significant inflammatory changes. This is good news. As discussed, your symptoms may be related to side effects from your pain medication. Your recent alpha gal test was positive as well, so hopefully a more strict meat-free/dairy free diet may help as well    Current Medications, Allergies, Past Medical History, Past Surgical History, Family History and Social History were reviewed in [employer] record.   Review of Systems:   Constitutional: Negative for fever, sweats, chills or weight loss.  Respiratory: Negative for shortness of breath.   Cardiovascular: Negative for chest pain, palpitations and leg swelling.   Gastrointestinal: See HPI.  Musculoskeletal: Negative for back pain or muscle aches.  Neurological: Negative for dizziness, headaches or paresthesias.    Physical Exam: There were no vitals taken for this visit. General: in no acute distress. Head: Normocephalic and atraumatic. Eyes: No scleral icterus. Conjunctiva pink . Ears: Normal auditory acuity. Mouth: Dentition intact. No ulcers or lesions.  Lungs: Clear throughout to auscultation. Heart: Regular rate and rhythm, no murmur. Abdomen: Soft, nontender and nondistended. No masses or hepatomegaly. Normal bowel sounds x 4 quadrants.  Rectal: *** Musculoskeletal: Symmetrical with no gross deformities. Extremities: No edema. Neurological: Alert oriented x 4. No focal deficits.  Psychological: Alert and cooperative. Normal mood and affect  Assessment and Recommendations:  63 year old female with non-Hodgkin's lymphoma, MALT lymphoma of the stomach initially diagnosed per EGD 01/2022 s/p radiation therapy Oct - November 2023. Repeat EGD 10/31/2022 and 03/01/2023 without evidence of recurrent MALT lymphoma.   CBD and intrahepatic/extrahepatic biliary ductal dilatation per prior CT and MRI imaging. EUS 02/2023 showed dilatation to the CBD and in the common hepatic duct without evidence of choledocholithiasis with pancreatic parenchymal abnormalities consisting of hyperechoic strands noted in the entire pancreas and incomplete pancreas divisum.    History of colon polyps. Colonoscopy 01/2022  identified one 10 mm polyp removed from the cecum, one 3 mm polyp removed from the transverse colon, one 3 mm polyp removed from the descending colon and multiple 1 to 4 mm polyps removed from the rectum, rectosigmoid and sigmoid colon.  Path report consistent with tubular adenomatous and hyperplastic polyps.  - Next colon polyp surveillance colonoscopy due 01/2025   Constipation, chronic opioid use a contributing factor - Continue MiraLAX twice daily -  Linzess  145 mcg 1 tab p.o. 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

## 2024-09-30 ENCOUNTER — Ambulatory Visit: Admitting: Nurse Practitioner

## 2024-09-30 NOTE — Progress Notes (Unsigned)
 "    09/30/2024 Melissa Conley [id number] 1961/08/22  Primary Gastroenterologist: Dr. Stacia    Chief Complaint: Positive alpha gal  History of Present Illness: Melissa Conley is a 64 year old female with a past medical history of anxiety, chronic pain, COPD, thyroid cancer status post thyroidectomy 01/2021, hepatic steatosis, Gastric MALT lymphoma and colon polyps.    Discussed the use of AI scribe software for clinical note transcription with the patient, who gave verbal consent to proceed.  History of Present Illness  As previously reviewed,  She was initially seen by Dr. Stacia 01/10/2022 due to having nausea, RUQ pain and decreased appetite.  She subsequently underwent an EGD and colonoscopy 02/08/2022.  The EGD identified a localized area of inflamed/eroded mucosa along the lesser curvature of distal body. Biopsies were consistent with gastric MALT lymphoma, testing for H. pylori was negative. She underwent CTs of the chest, abdomen, and pelvis on 02/28/2022 which showed stable peribronchial nodularity in the right middle lobe, no new focal pulmonary nodules and moderate intra and extrapelvic biliary duct dilatation, slightly progressive. The colonoscopy identified one 10 mm polyp removed from the cecum, one 3 mm polyp removed from the transverse colon, one 3 mm polyp removed from the descending colon and many 1 to 4 mm polyps removed from the rectum, rectosigmoid and sigmoid colon.  Path report consistent with tubular adenomatous and hyperplastic polyps.  She was advised to repeat a colonoscopy in 3 years.  She was evaluated by oncologist Dr. Cloretta regarding MALT lymphoma and underwent radiation therapy 10/12 - 07/20/2022.   She was seen in office by Dr. Stacia 09/28/2022 to schedule a repeat EGD as recommended by her radiation oncologist.  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  However, path report was negative for MALT lymphoma.  The abnormal appearance of her stomach was likely due to changes from radiation and healing of the prior MALT lymphoma.   Due to prior CTAP 03/09/2022 showing a dilated CBD and dilated intrahepatic ducts, and RUQ sonogram was done 10/17/2022 which showed a dilated CBD measuring 15.9 with a intrahepatic ductal dilatation.  Abdominal MRI/MRCP with and without contrast 10/27/2022 showed unchanged intra/extrahepatic biliary ductal dilatation status postcholecystectomy with CBD dilatation measuring up to 1.3 cm without evidence of choledocholithiasis or ampullary abnormality and pancreas divisum was noted.  Dr. Wilhelmenia was consulted and the patient underwent an EGD with EUS per Dr. Wilhelmenia 02/26/2023.  The EGD showed a 1 cm hiatal hernia with gastric mucosal atrophy without evidence of MALT lymphoma.  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  I last saw patient in office on 05/01/2024.  At that time, she endorsed having a decreased appetite with recurrent RUQ pain which he stated was similar to the pain she experienced when diagnosed with gastric MALT lymphoma.  She tested positive for alpha gal 07/03/2024.  The American [employer] Association recommends strict avoidance of alpha-gal-containing foods as the cornerstone of treatment for alpha-gal syndrome.[1] This includes eliminating all mammalian meat (pork, beef, venison, and any animal with hair) and mammalian-derived products  such as lard, butter, milk, and  gelatin-containing foods.[1] Dairy products, particularly high-fat items like ice cream, cream, and cream cheese, contain smaller amounts of alpha-gal and should also be avoided.[1] Fish, seafood, turkey, chicken, and other fowl are acceptable alternatives.[1] Patients should be aware that processed foods may contain small amounts of animal-derived products, and restaurants may cross-contaminate foods, which can be problematic for highly sensitive individuals.[1] Some patients have reported reactions from inhaling aerosolized alpha-gal when frying bacon or beef products.       Latest Ref Rng & Units 05/01/2024   11:57 AM 03/28/2022    3:32 PM 01/18/2021   12:00 AM  CBC  WBC 4.0 - 10.5 K/uL 4.5  7.5  6.5   Hemoglobin 12.0 - 15.0 g/dL 86.5  86.7  86.9   Hematocrit 36.0 - 46.0 % 40.8  41.4  39   Platelets 150.0 - 400.0 K/uL 313.0  322  305        Latest Ref Rng & Units 05/01/2024   11:57 AM 05/03/2023   11:15 AM 10/19/2022    4:09 PM  CMP  Glucose 70 - 99 mg/dL 893  99    BUN 6 - 23 mg/dL 12  9    Creatinine 9.59 - 1.20 mg/dL 9.28  9.32    Sodium 864 - 145 mEq/L 140  139    Potassium 3.5 - 5.1 mEq/L 4.3  4.0    Chloride 96 - 112 mEq/L 103  104    CO2 19 - 32 mEq/L 28  29    Calcium  8.4 - 10.5 mg/dL 9.3  8.7    Total Protein 6.0 - 8.3 g/dL 7.5  6.6    6.6  7.1   Total Bilirubin 0.2 - 1.2 mg/dL 0.6  0.3    0.3  0.5   Alkaline Phos 39 - 117 U/L 170  105    105  186   AST 0 - 37 U/L 40  25    25  72   ALT 0 - 35 U/L 72  24    24  68      RADIOLOGY STUDIES:    RUQ SONO 10/17/2022:   COMPARISON:  CT scan of the chest, abdomen, and pelvis March 08, 2022   FINDINGS: Gallbladder: Surgically absent   Common bile duct: Diameter: Measures 15.9 mm today versus 12 mm on the CT scan of the chest, abdomen, and pelvis dated March 08, 2022 and 9 mm on the MRI of the abdomen November 21, 2021.   Liver: Diffuse increased echogenicity throughout the liver. No liver mass. Portal vein is patent on  color Doppler imaging with normal direction of blood flow towards the liver.   Other: Intrahepatic ductal dilatation.   IMPRESSION: 1. The common bile duct measures 15.9 mm which is more prominent than typically seen after cholecystectomy. Additionally, the caliber of the dilated common bile duct has progressively worsened as described above. Recommend correlation with LFTs. Consider MRCP or ERCP for better evaluation. 2. Hepatic steatosis.   ABDOMINAL MRI/MRCP WITH AND WITHOUT CONTRAST 10/27/2022: COMPARISON:  Abdominal ultrasound, 10/17/2022, CT chest abdomen pelvis, 03/08/2022 MR abdomen, 11/21/2021   FINDINGS: Lower chest: No acute abnormality.   Hepatobiliary: No solid liver abnormality is seen. Status post cholecystectomy. Unchanged intra and extrahepatic biliary ductal dilatation, common bile duct measuring up to 1.3 cm in caliber. No obstructing calculus or other abnormality identified to the ampulla.   Pancreas: Incidental note of pancreas divisum. No pancreatic ductal dilatation or surrounding  inflammatory changes.   Spleen: Normal in size without significant abnormality.   Adrenals/Urinary Tract: Adrenal glands are unremarkable. Kidneys are normal, without renal calculi, solid lesion, or hydronephrosis.   Stomach/Bowel: Stomach is within normal limits. No evidence of bowel wall thickening, distention, or inflammatory changes. Moderate burden of stool in the included colon.   Vascular/Lymphatic: No significant vascular findings are present. No enlarged abdominal lymph nodes.   Other: No abdominal wall hernia or abnormality. No ascites.   Musculoskeletal: No acute or significant osseous findings.   IMPRESSION: 1. Unchanged intra and extrahepatic biliary ductal dilatation status post cholecystectomy, common bile duct measuring up to 1.3 cm in caliber. No obstructing calculus or other abnormality identified to the ampulla. This is not meaningfully changed  compared to prior examinations, including MR dated 11/21/2021 and of doubtful clinical significance in the absence of clinical biochemical evidence of cholestasis. 2. Incidental note of pancreas divisum.   Abdominal MRI/MRCP with and without contrast 06/19/2024:  EXAM: MRI ABDOMEN WITHOUT AND WITH CONTRAST (INCLUDING MRCP)   TECHNIQUE: Multiplanar multisequence MR imaging of the abdomen was performed both before and after the administration of intravenous contrast. Heavily T2-weighted images of the biliary and pancreatic ducts were obtained, and three-dimensional MRCP images were rendered by post processing.   CONTRAST:  8.50mL GADAVIST  GADOBUTROL  1 MMOL/ML IV SOLN   COMPARISON:  MRI abdomen from 10/27/2022.   FINDINGS: Lower chest: Unremarkable MR appearance to the lung bases. No pleural effusion. No pericardial effusion. Normal heart size.   Hepatobiliary: The liver is mild-to-moderately enlarged in size with length of 17.7 cm. Noncirrhotic configuration. No focal liver lesion. There is moderate diffuse hepatic steatosis. There is mild central intrahepatic bile duct dilation. Extrahepatic bile duct is also dilated measuring up to 1.6 cm in the proximal portion 1.0 cm in the midportion and gradually tapers measuring up to 3-4 mm just before the ampulla of Vater. It appears essentially unchanged since the prior study, when remeasured in similar fashion. No obstructing mass or choledocholithiasis. Status post cholecystectomy.   Pancreas: Note is made of pancreatic divisum. There is normal T1 hyperintense appearance of the pancreas. No focal lesion. No peripancreatic fat stranding. Main pancreatic duct is not dilated.   Spleen: Size within normal limits. Redemonstration of a subcentimeter simple cyst arising from the posterior aspect.   Adrenals/Urinary Tract: Unremarkable adrenal glands. Mild fullness noted in the bilateral renal collecting systems without  frank hydronephrosis. Bilateral extrarenal pelvis noted. No suspicious renal mass. There is a single subcentimeter sized partially exophytic cyst arising from the right kidney lower pole, anteriorly.   Stomach/Bowel: Visualized portions within the abdomen are unremarkable. No disproportionate dilation of bowel loops.   Vascular/Lymphatic: No pathologically enlarged lymph nodes identified. No abdominal aortic aneurysm demonstrated. No ascites.   Other:  None.   Musculoskeletal: No suspicious bone lesions identified.   IMPRESSION: 1. Redemonstration of mildly dilated intra and extrahepatic bile ducts, essentially unchanged since the prior study. No obstructing mass or choledocholithiasis. 2. Pancreatic divisum. No imaging signs of acute pancreatitis. 3. Multiple other nonacute observations, as described above.     PAST GI PROCEDURES:   EGD 02/08/2022: - The examined portions of the nasopharynx, oropharynx and larynx were normal. - Normal esophagus.  - Ulcerated mucosa in the gastric body. Biopsied.  - Normal examined duodenum.   Colonoscopy 02/08/2022: - One 10 mm polyp in the cecum, removed with a cold snare. Resected and retrieved.  - One 3 mm polyp in the transverse colon, removed with a  cold snare. Resected and retrieved.  - One 3 mm polyp in the descending colon, removed with a cold snare. Resected and retrieved.  - Many 1 to 4 mm polyps in the rectum, at the recto-sigmoid colon and in the sigmoid colon, removed with a cold snare. Resected and retrieved.  - The examined portion of the ileum was normal. - Recall colonoscopy 3 years    1. 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 - MOLECULAR TESTING TO CONFIRM B-CELL CLONALITY IS PENDING AND WILL BE REPORTED IN AN  ADDENDUM. - SEE COMMENT. 2. Surgical [P], colon, descending, transverse, and cecum, polyp (3) - TUBULAR ADENOMA, FRAGMENTS. 3. Surgical [P], colon, sigmoid, polyp (1) - HYPERPLASTIC POLYP.   EGD 10/19/2022: - The examined portions of the nasopharynx, oropharynx and larynx were normal.  - Normal esophagus.  - Abnormal appearing focal ulcerated mucosa in the anterior wall of the gastric body and greater curvature of the gastric body. Biopsied.  - Focal ulcerated mucosa in the gastric body. Biopsied.  - Erythematous mucosa in the stomach. Biopsied. 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  - Normal examined duodenum.  - Findings are suggestive of persistent MALT, possibly with progression.   1. Surgical [P], gastric antrum ANTRAL MUCOSA WITH CHANGES OF REACTIVE GASTROPATHY. NEGATIVE FOR HELICOBACTER PYLORI. NEGATIVE FOR ATYPICAL LYMPHOID INFILTRATES. 2. 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 NEGATIVE FOR HELICOBACTER PYLORI. NEGATIVE FOR ATYPICAL LYMPHOID INFILTRATES. 3. Surgical [P], focal ulceration of gastric body ANTRAL AND OXYNTIC MUCOSA WITH SLIGHT CHRONIC INFLAMMATION UREMIA. NEGATIVE FOR HELICOBACTER PYLORI. 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    EGD/EUS 02/26/2023 by Dr. Wilhelmenia:   EGD impression: - No gross lesions in the entire esophagus. Z-line regular, 36 cm from the incisors.  - 1 cm hiatal hernia.  - Gastric mucosal atrophy noted on the anterior wall of the gastric body. Biopsied.  - No other gross lesions in the entire stomach.  - No gross lesions in the duodenal bulb, in the first portion of the duodenum and in the second portion of the duodenum. - Normal major papilla.  A. STOMACH, ANTERIOR BODY, BIOPSY:  - Oxyntic mucosa with minimal chronic inflammation and focal, mild  proton pump inhibitor type  effect/early fundic gland polyp like change  - Negative for Helicobacter organisms on HE stain  - Negative for intestinal metaplasia, dysplasia or malignancy    EUS impression:  - There was dilation in the common bile duct and in the common hepatic duct. No evidence of any stones with normal contour throughout.  - Pancreatic parenchymal abnormalities consisting of hyperechoic strands were noted in the entire pancreas.  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 The pancreatic duct was noted within the head going towards the major papilla and minor papilla (type III pancreas divisum); normal appearance of the pancreatic duct within the genu of the pancreas, body of the pancreas and tail of the pancreas otherwise.  - No malignant-appearing lymph nodes were visualized in the celiac region (level 20), peripancreatic region and porta hepatis region.   EGD 07/03/2024: - The examined portions of the nasopharynx, oropharynx and larynx were normal.  - Normal esophagus.  - Scarring in the incisura.  - Normal examined duodenum.  -  Biopsies were taken with a cold forceps for Helicobacter pylori testing.  - No obvious abnormalities to explain patient's symptoms. Suspect symptoms may be related to opioid effects on gastric motility.  1. Surgical [P], gastric antrum :      - BENIGN GASTRIC MUCOSA WITH NO SPECIFIC PATHOLOGIC CHANGE.      - NO SIGNIFICANT LYMPHOCYTIC INFILTRATE.      - NO EVIDENCE OF H. PYLORI ON H&E STAIN.       2. Surgical [P], gastric incisura :      - BENIGN GASTRIC MUCOSA WITH NO SPECIFIC PATHOLOGIC CHANGE.      - NO SIGNIFICANT LYMPHOCYTIC INFILTRATE.      - NO EVIDENCE OF H. PYLORI ON H&E STAIN.       3. Surgical [P], gastric body :      - BENIGN GASTRIC MUCOSA WITH NO SPECIFIC PATHOLOGIC CHANGE.      - NO SIGNIFICANT LYMPHOCYTIC INFILTRATE.      - NO EVIDENCE OF H. PYLORI ON H&E STAIN.   Current Medications, Allergies, Past Medical History, Past Surgical History, Family  History and Social History were reviewed in [employer] record.   Review of Systems:   Constitutional: Negative for fever, sweats, chills or weight loss.  Respiratory: Negative for shortness of breath.   Cardiovascular: Negative for chest pain, palpitations and leg swelling.  Gastrointestinal: See HPI.  Musculoskeletal: Negative for back pain or muscle aches.  Neurological: Negative for dizziness, headaches or paresthesias.    Physical Exam: There were no vitals taken for this visit. General: in no acute distress. Head: Normocephalic and atraumatic. Eyes: No scleral icterus. Conjunctiva pink . Ears: Normal auditory acuity. Mouth: Dentition intact. No ulcers or lesions.  Lungs: Clear throughout to auscultation. Heart: Regular rate and rhythm, no murmur. Abdomen: Soft, nontender and nondistended. No masses or hepatomegaly. Normal bowel sounds x 4 quadrants.  Rectal: Deferred.  Musculoskeletal: Symmetrical with no gross deformities. Extremities: No edema. Neurological: Alert oriented x 4. No focal deficits.  Psychological: Alert and cooperative. Normal mood and affect  Assessment and Recommendations:  64 year old female with non-Hodgkin's lymphoma, MALT lymphoma of the stomach initially diagnosed per EGD 01/2022 s/p radiation therapy Oct - November 2023. Repeat EGD 10/31/2022, 03/01/2023 and 06/2024 without evidence of recurrent MALT lymphoma.    CBD and intrahepatic/extrahepatic biliary ductal dilatation per prior CT and MRI imaging. EUS 02/2023 showed dilatation to the CBD and in the common hepatic duct without evidence of choledocholithiasis with pancreatic parenchymal abnormalities consisting of hyperechoic strands noted in the entire pancreas and incomplete pancreas divisum.  Patient with recurrent N/V and RUQ pain x 2 weeks.     History of colon polyps. Colonoscopy 01/2022  identified one 10 mm polyp removed from the cecum, one 3 mm polyp removed from the  transverse colon, one 3 mm polyp removed from the descending colon and multiple 1 to 4 mm polyps removed from the rectum, rectosigmoid and sigmoid colon.  Path report consistent with tubular adenomatous and hyperplastic polyps.  - Next colon polyp surveillance colonoscopy due 01/2025   Constipation, chronic opioid use a contributing factor - Continue MiraLAX twice daily - Linzess  145 mcg 1 tab p.o. 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

## 2024-10-07 ENCOUNTER — Ambulatory Visit: Admitting: Gastroenterology

## 2024-10-07 NOTE — Progress Notes (Unsigned)
 "  Chief Complaint: Primary GI MD: Dr. Stacia  HPI: 64 year old female with non-Hodgkin's lymphoma, MALT lymphoma of the stomach initially diagnosed per EGD 01/2022 s/p radiation therapy Oct - November 2023. Repeat EGD 10/31/2022, 03/01/2023, 06/2024 without evidence of recurrent MALT lymphoma, presents for follow-up of recently diagnosed positive alpha gal panel   Discussed the use of AI scribe software for clinical note transcription with the patient, who gave verbal consent to proceed.  History of Present Illness      PREVIOUS GI WORKUP   RUQ SONO 10/17/2022:   COMPARISON:  CT scan of the chest, abdomen, and pelvis March 08, 2022   FINDINGS: Gallbladder: Surgically absent   Common bile duct: Diameter: Measures 15.9 mm today versus 12 mm on the CT scan of the chest, abdomen, and pelvis dated March 08, 2022 and 9 mm on the MRI of the abdomen November 21, 2021.   Liver: Diffuse increased echogenicity throughout the liver. No liver mass. Portal vein is patent on color Doppler imaging with normal direction of blood flow towards the liver.   Other: Intrahepatic ductal dilatation.   IMPRESSION: 1. The common bile duct measures 15.9 mm which is more prominent than typically seen after cholecystectomy. Additionally, the caliber of the dilated common bile duct has progressively worsened as described above. Recommend correlation with LFTs. Consider MRCP or ERCP for better evaluation. 2. Hepatic steatosis.   ABDOMINAL MRI/MRCP WITH AND WITHOUT CONTRAST 10/27/2022: COMPARISON:  Abdominal ultrasound, 10/17/2022, CT chest abdomen pelvis, 03/08/2022 MR abdomen, 11/21/2021   FINDINGS: Lower chest: No acute abnormality.   Hepatobiliary: No solid liver abnormality is seen. Status post cholecystectomy. Unchanged intra and extrahepatic biliary ductal dilatation, common bile duct measuring up to 1.3 cm in caliber. No obstructing calculus or other abnormality identified to the ampulla.    Pancreas: Incidental note of pancreas divisum. No pancreatic ductal dilatation or surrounding inflammatory changes.   Spleen: Normal in size without significant abnormality.   Adrenals/Urinary Tract: Adrenal glands are unremarkable. Kidneys are normal, without renal calculi, solid lesion, or hydronephrosis.   Stomach/Bowel: Stomach is within normal limits. No evidence of bowel wall thickening, distention, or inflammatory changes. Moderate burden of stool in the included colon.   Vascular/Lymphatic: No significant vascular findings are present. No enlarged abdominal lymph nodes.   Other: No abdominal wall hernia or abnormality. No ascites.   Musculoskeletal: No acute or significant osseous findings.   IMPRESSION: 1. Unchanged intra and extrahepatic biliary ductal dilatation status post cholecystectomy, common bile duct measuring up to 1.3 cm in caliber. No obstructing calculus or other abnormality identified to the ampulla. This is not meaningfully changed compared to prior examinations, including MR dated 11/21/2021 and of doubtful clinical significance in the absence of clinical biochemical evidence of cholestasis. 2. Incidental note of pancreas divisum.    PAST GI PROCEDURES:   EGD 02/08/2022: - The examined portions of the nasopharynx, oropharynx and larynx were normal. - Normal esophagus.  - Ulcerated mucosa in the gastric body. Biopsied.  - Normal examined duodenum.   Colonoscopy 02/08/2022: - One 10 mm polyp in the cecum, removed with a cold snare. Resected and retrieved.  - One 3 mm polyp in the transverse colon, removed with a cold snare. Resected and retrieved.  - One 3 mm polyp in the descending colon, removed with a cold snare. Resected and retrieved.  - Many 1 to 4 mm polyps in the rectum, at the recto-sigmoid colon and in the sigmoid colon, removed with a cold snare. Resected and  retrieved.  - The examined portion of the ileum was normal. - Recall colonoscopy 3  years    1. 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 - MOLECULAR TESTING TO CONFIRM B-CELL CLONALITY IS PENDING AND WILL BE REPORTED IN AN ADDENDUM. - SEE COMMENT. 2. Surgical [P], colon, descending, transverse, and cecum, polyp (3) - TUBULAR ADENOMA, FRAGMENTS. 3. Surgical [P], colon, sigmoid, polyp (1) - HYPERPLASTIC POLYP.   EGD 10/19/2022: - The examined portions of the nasopharynx, oropharynx and larynx were normal.  - Normal esophagus.  - Abnormal appearing focal ulcerated mucosa in the anterior wall of the gastric body and greater curvature of the gastric body. Biopsied.  - Focal ulcerated mucosa in the gastric body. Biopsied.  - Erythematous mucosa in the stomach. Biopsied. 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  - Normal examined duodenum.  - Findings are suggestive of persistent MALT, possibly with progression.   1. Surgical [P], gastric antrum ANTRAL MUCOSA WITH CHANGES OF REACTIVE GASTROPATHY. NEGATIVE FOR HELICOBACTER PYLORI. NEGATIVE FOR ATYPICAL LYMPHOID INFILTRATES. 2. 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 NEGATIVE FOR HELICOBACTER PYLORI. NEGATIVE FOR ATYPICAL LYMPHOID INFILTRATES. 3. Surgical [P], focal ulceration of gastric body ANTRAL AND OXYNTIC MUCOSA WITH SLIGHT CHRONIC INFLAMMATION UREMIA. NEGATIVE FOR HELICOBACTER PYLORI. 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    EGD/EUS 02/26/2023 by Dr. Wilhelmenia:   EGD impression: - No gross lesions in the entire esophagus. Z-line regular, 36 cm from the incisors.  - 1 cm hiatal hernia.  - Gastric mucosal atrophy noted on the anterior wall of the  gastric body. Biopsied.  - No other gross lesions in the entire stomach.  - No gross lesions in the duodenal bulb, in the first portion of the duodenum and in the second portion of the duodenum. - Normal major papilla.  A. STOMACH, ANTERIOR BODY, BIOPSY:  - Oxyntic mucosa with minimal chronic inflammation and focal, mild  proton pump inhibitor type effect/early fundic gland polyp like change  - Negative for Helicobacter organisms on HE stain  - Negative for intestinal metaplasia, dysplasia or malignancy    EUS impression:  - There was dilation in the common bile duct and in the common hepatic duct. No evidence of any stones with normal contour throughout.  - Pancreatic parenchymal abnormalities consisting of hyperechoic strands were noted in the entire pancreas.  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 The pancreatic duct was noted within the head going towards the major papilla and minor papilla (type III pancreas divisum); normal appearance of the pancreatic duct within the genu of the pancreas, body of the pancreas and tail of the pancreas otherwise.  - No malignant-appearing lymph nodes were visualized in the celiac region (level 20), peripancreatic region and porta hepatis region.  EGD 06/2024 for nausea and vomiting - The examined portions of the nasopharynx, oropharynx and larynx were normal.  - Normal esophagus.  - Scarring in the incisura.  - Normal examined duodenum.  - Biopsies were taken with a cold forceps for Helicobacter pylori testing.  - No obvious abnormalities to explain patient' s symptoms. Suspect symptoms may be related to opioid effects on gastric motility.  1. Surgical [P], gastric antrum :      - BENIGN GASTRIC  MUCOSA WITH NO SPECIFIC PATHOLOGIC CHANGE.      - NO SIGNIFICANT LYMPHOCYTIC INFILTRATE.      - NO EVIDENCE OF H. PYLORI ON H&E STAIN.       2. Surgical [P], gastric incisura :      - BENIGN GASTRIC MUCOSA WITH NO SPECIFIC PATHOLOGIC CHANGE.       - NO SIGNIFICANT LYMPHOCYTIC INFILTRATE.      - NO EVIDENCE OF H. PYLORI ON H&E STAIN.       3. Surgical [P], gastric body :      - BENIGN GASTRIC MUCOSA WITH NO SPECIFIC PATHOLOGIC CHANGE.      - NO SIGNIFICANT LYMPHOCYTIC INFILTRATE.      - NO EVIDENCE OF H. PYLORI ON H&E STAIN   Past Medical History:  Diagnosis Date   Angina at rest    Anxiety    hx (03/26/2018)   Bulging lumbar disc 03/28/2016   Overview:  L4-5, left   Chronic bronchitis (HCC)    Chronic lower back pain    Chronic pain syndrome 11/02/2015   Common migraine    monthly (03/26/2018)   Dysuria 01/31/2017   Fall from high place 03/25/2018   deck; fell ~ 35ft; broke both feet   Frequent headaches    qod (03/26/2018)   GERD without esophagitis 01/28/2018   History of abnormal mammogram 12/20/2016   IBS (irritable bowel syndrome)    Lumbar radiculopathy 11/02/2015   Menopausal disorder 01/05/2016   Mucosa-associated lymphoid tissue (MALT) lymphoma (HCC) 2023   Neck pain 11/02/2015   Numbness and tingling of both legs below knees 02/10/2016   Osteoporosis    Ovarian cancer (HCC) 1990   Pneumonia    twice (03/26/2018)   Postmenopausal HRT (hormone replacement therapy) 01/31/2017   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/19/2016   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    Thoracic back pain 11/02/2015   Thyroid cancer (HCC) 2022   Unstable angina (HCC) 01/28/2018    Past Surgical History:  Procedure Laterality Date   BACK SURGERY     BIOPSY  02/26/2023   Procedure: BIOPSY;  Surgeon: Wilhelmenia Aloha Raddle., MD;  Location: [facility];  Service: Gastroenterology;;   CARDIAC CATHETERIZATION     CHOLECYSTECTOMY OPEN     DILATION AND CURETTAGE OF UTERUS     ESOPHAGOGASTRODUODENOSCOPY N/A 02/26/2023   Procedure: ESOPHAGOGASTRODUODENOSCOPY (EGD);  Surgeon: Wilhelmenia Aloha Raddle., MD;  Location: [facility];  Service: Gastroenterology;  Laterality: N/A;    EUS N/A 02/26/2023   Procedure: UPPER ENDOSCOPIC ULTRASOUND (EUS) RADIAL;  Surgeon: Wilhelmenia Aloha Raddle., MD;  Location: [facility];  Service: Gastroenterology;  Laterality: N/A;   ORIF ANKLE FRACTURE Right 03/27/2018   Procedure: OPEN REDUCTION INTERNAL FIXATION (ORIF) ANKLE FRACTURE;  Surgeon: Yvone Rush, MD;  Location: [facility];  Service: Orthopedics;  Laterality: Right;   POSTERIOR LUMBAR FUSION  2000   SKIN CANCER EXCISION     eyelid/face   THYROIDECTOMY     TUBAL LIGATION     VAGINAL HYSTERECTOMY      Current Outpatient Medications  Medication Sig Dispense Refill   acetaminophen  (TYLENOL ) 500 MG tablet Take 500 mg by mouth every 6 (six) hours as needed for moderate pain.     albuterol  (VENTOLIN  HFA) 108 (90 Base) MCG/ACT inhaler Inhale 1 puff into the lungs 4 (four) times daily as needed for shortness of breath.      aspirin 81 MG EC tablet Take 81 mg by  mouth daily.     EPINEPHrine  0.3 mg/0.3 mL IJ SOAJ injection Inject 0.3 mg into the muscle as needed for anaphylaxis.     estrogens , conjugated, (PREMARIN ) 0.625 MG tablet Take 0.625 mg by mouth daily. (Patient taking differently: Take 9 mg by mouth daily.)     levothyroxine (SYNTHROID) 137 MCG tablet Take 137 mcg by mouth daily.     linaclotide  (LINZESS ) 145 MCG CAPS capsule Take 1 capsule (145 mcg total) by mouth daily before breakfast. 30 capsule 3   metoCLOPramide  (REGLAN ) 5 MG tablet Take 1 tablet (5 mg total) by mouth 4 (four) times daily. 120 tablet 0   Multiple Vitamin (MULTIVITAMIN) tablet Take 1 tablet by mouth daily.     naloxone (NARCAN) nasal spray 4 mg/0.1 mL Place 1 spray into the nose as needed for opioid reversal.     omeprazole (PRILOSEC) 20 MG capsule Take 20 mg by mouth daily.     oxyCODONE  (ROXICODONE ) 15 MG immediate release tablet Take 15 mg by mouth every 4 (four) hours as needed for pain.     polyethylene glycol (MIRALAX / GLYCOLAX) 17 g packet Take 17 g by mouth 3 (three) times daily.     prochlorperazine   (COMPAZINE ) 10 MG tablet Take 1 tablet (10 mg total) by mouth every 6 (six) hours as needed for nausea or vomiting. 15 tablet 0   Soft Lens Products (OPTI-FREE PUREMOIST) SOLN Place 1 drop into both eyes daily as needed (irritation).     No current facility-administered medications for this visit.    Allergies as of 10/07/2024 - Review Complete 07/03/2024  Allergen Reaction Noted   Haemophilus b polysaccharide vaccine Hives 08/23/2017   Influenza virus vaccine Hives 08/23/2017   Penicillins Anaphylaxis and Hives 11/04/2015   Gabapentin Other (See Comments) 02/10/2016   Methocarbamol Nausea Only 11/04/2015   Morphine Nausea And Vomiting 06/02/2016   Tapentadol Nausea Only 11/15/2016   Tizanidine  Nausea Only 11/04/2015    Family History  Problem Relation Age of Onset   Heart attack Mother    Lung cancer Mother    Lymphoma Mother    Colon polyps Mother    Heart disease Father    Ovarian cancer Sister    Brain cancer Sister    Diabetes Sister    Colon polyps Sister    Colon cancer Brother    Bone cancer Brother    Liver cancer Brother    Diabetes Brother    Colon polyps Brother    Irritable bowel syndrome Son    Diabetes Cousin    Irritable bowel syndrome Granddaughter    Esophageal cancer Neg Hx    Rectal cancer Neg Hx    Stomach cancer Neg Hx     Social History   Socioeconomic History   Marital status: Widowed    Spouse name: Not on file   Number of children: 3   Years of education: Not on file   Highest education level: 10th grade  Occupational History   Not on file  Tobacco Use   Smoking status: Former    Current packs/day: 0.00    Average packs/day: 1.5 packs/day for 30.0 years (45.0 ttl pk-yrs)    Types: Cigarettes    Start date: 41    Quit date: 2010    Years since quitting: 16.0   Smokeless tobacco: Never  Vaping Use   Vaping status: Never Used  Substance and Sexual Activity   Alcohol use: Not Currently   Drug use: Never   Sexual activity:  Not  Currently  Other Topics Concern   Not on file  Social History Narrative   Not on file   Social Drivers of Health   Tobacco Use: Medium Risk (07/03/2024)   Patient History    Smoking Tobacco Use: Former    Smokeless Tobacco Use: Never    Passive Exposure: Not on file  Financial Resource Strain: Not on file  Food Insecurity: Food Insecurity Present (02/23/2022)   Received from [facility]   Epic    Within the past 12 months, you worried that your food would run out before you got the money to buy more.: Sometimes true    Within the past 12 months, the food you bought just didn't last and you didn't have money to get more.: Sometimes true  Transportation Needs: Not on file  Physical Activity: Not on file  Stress: Not on file  Social Connections: Unknown (01/09/2022)   Received from [facility]   Social Network    Social Network: Not on file  Intimate Partner Violence: Unknown (12/14/2021)   Received from [facility]   HITS    Physically Hurt: Not on file    Insult or Talk Down To: Not on file    Threaten Physical Harm: Not on file    Scream or Curse: Not on file  Depression (PHQ2-9): Not on file  Alcohol Screen: Not on file  Housing: Not on file  Utilities: Not on file  Health Literacy: Not on file    Review of Systems:    Constitutional: No weight loss, fever, chills, weakness or fatigue HEENT: Eyes: No change in vision               Ears, Nose, Throat:  No change in hearing or congestion Skin: No rash or itching Cardiovascular: No chest pain, chest pressure or palpitations   Respiratory: No SOB or cough Gastrointestinal: See HPI and otherwise negative Genitourinary: No dysuria or change in urinary frequency Neurological: No headache, dizziness or syncope Musculoskeletal: No new muscle or joint pain Hematologic: No bleeding or bruising Psychiatric: No history of depression or anxiety    Physical Exam:  Vital signs: There were no vitals taken for this  visit.  Constitutional: NAD, alert and cooperative Head:  Normocephalic and atraumatic. Eyes:   PEERL, EOMI. No icterus. Conjunctiva pink. Respiratory: Respirations even and unlabored. Lungs clear to auscultation bilaterally.   No wheezes, crackles, or rhonchi.  Cardiovascular:  Regular rate and rhythm. No peripheral edema, cyanosis or pallor.  Gastrointestinal:  Soft, nondistended, nontender. No rebound or guarding. Normal bowel sounds. No appreciable masses or hepatomegaly. Rectal:  Declines Msk:  Symmetrical without gross deformities. Without edema, no deformity or joint abnormality.  Neurologic:  Alert and  oriented x4;  grossly normal neurologically.  Skin:   Dry and intact without significant lesions or rashes. Psychiatric: Oriented to person, place and time. Demonstrates good judgement and reason without abnormal affect or behaviors.  Physical Exam    RELEVANT LABS AND IMAGING: CBC    Component Value Date/Time   WBC 4.5 05/01/2024 1157   RBC 4.64 05/01/2024 1157   HGB 13.4 05/01/2024 1157   HGB 13.2 03/28/2022 1532   HGB 12.9 02/06/2018 1110   HCT 40.8 05/01/2024 1157   HCT 39.8 02/06/2018 1110   PLT 313.0 05/01/2024 1157   PLT 322 03/28/2022 1532   PLT 359 02/06/2018 1110   MCV 87.8 05/01/2024 1157   MCV 89 02/06/2018 1110   MCH 28.3 03/28/2022 1532  MCHC 32.8 05/01/2024 1157   RDW 12.7 05/01/2024 1157   RDW 13.3 02/06/2018 1110   LYMPHSABS 1.2 05/01/2024 1157   MONOABS 0.6 05/01/2024 1157   EOSABS 0.2 05/01/2024 1157   BASOSABS 0.1 05/01/2024 1157    CMP     Component Value Date/Time   NA 140 05/01/2024 1157   NA 139 07/20/2021 0925   K 4.3 05/01/2024 1157   CL 103 05/01/2024 1157   CO2 28 05/01/2024 1157   GLUCOSE 106 (H) 05/01/2024 1157   BUN 12 05/01/2024 1157   BUN 10 07/20/2021 0925   CREATININE 0.71 05/01/2024 1157   CREATININE 0.99 03/28/2022 1532   CALCIUM  9.3 05/01/2024 1157   PROT 7.5 05/01/2024 1157   PROT 7.3 02/06/2018 1110   ALBUMIN  4.2 05/01/2024 1157   ALBUMIN 4.5 02/06/2018 1110   AST 40 (H) 05/01/2024 1157   AST 15 03/28/2022 1532   ALT 72 (H) 05/01/2024 1157   ALT 16 03/28/2022 1532   ALKPHOS 170 (H) 05/01/2024 1157   BILITOT 0.6 05/01/2024 1157   BILITOT 0.3 03/28/2022 1532   GFRNONAA >60 03/28/2022 1532   GFRAA >60 03/27/2018 2029     Assessment/Plan:   64 year old female with non-Hodgkin's lymphoma, MALT lymphoma of the stomach initially diagnosed per EGD 01/2022 s/p radiation therapy Oct - November 2023. Repeat EGD 10/31/2022, 03/01/2023, 06/2024 without evidence of recurrent MALT lymphoma  Nausea/vomiting/RUQ pain Alpha gal syndrome Seen by Elida 04/2024 and underwent EGD which was unrevealing.  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  Negative H. pylori.  Positive alpha gal. - CBC, CMP and lipase - EGD benefits and risks discussed including risk with sedation, risk of bleeding, perforation and infection  - Ensure clear 1 bottle daily - Compazine  10 mg 1 tab p.o. every 6 hours, patient has supply    Intrahepatic/gastrohepatic biliary ductal dilation CBD and intrahepatic/extrahepatic biliary ductal dilatation per prior CT and MRI imaging.  EUS 02/2023 showed dilatation to the CBD and in the common hepatic duct without evidence of choledocholithiasis with pancreatic parenchymal abnormalities consisting of hyperechoic strands noted in the entire pancreas and incomplete pancreas divisum.   MRCP 06/2024 with unchanged bile ducts, pancreatic division, no acute observations.   non-Hodgkin's lymphoma, MALT lymphoma of the stomach initially diagnosed per EGD 01/2022 s/p radiation therapy Oct - November 2023. Repeat EGD 10/31/2022, 03/01/2023, 06/2024 without evidence of recurrent MALT lymphoma  History of colon polyps Colonoscopy 01/2022  identified one 10 mm polyp removed from the cecum, one 3 mm polyp removed from the transverse colon, one 3 mm polyp removed from the  descending colon and multiple 1 to 4 mm polyps removed from the rectum, rectosigmoid and sigmoid colon.  Path report consistent with tubular adenomatous and hyperplastic polyps.  - Next colon polyp surveillance colonoscopy due 01/2025   Constipation, chronic opioid use a contributing factor - Continue MiraLAX twice daily - Linzess  145 mcg 1 tab p.o. 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   Liver: Diffuse increased echogenicity throughout the liver. No liver mass. Portal vein is patent  on color Doppler imaging with normal direction of blood flow towards the liver.   Other: Intrahepatic ductal dilatation.   IMPRESSION: 1. The common bile duct measures 15.9 mm which is more prominent than typically seen after cholecystectomy. Additionally, the caliber of the dilated common bile duct has progressively worsened as described above. Recommend correlation with LFTs. Consider MRCP or ERCP for better evaluation. 2. Hepatic steatosis.   ABDOMINAL MRI/MRCP WITH AND WITHOUT CONTRAST 10/27/2022: COMPARISON:  Abdominal ultrasound, 10/17/2022, CT chest abdomen pelvis, 03/08/2022 MR abdomen, 11/21/2021   FINDINGS: Lower chest: No acute abnormality.   Hepatobiliary: No solid liver abnormality is seen. Status post cholecystectomy. Unchanged intra and extrahepatic biliary ductal dilatation, common bile duct measuring up to 1.3 cm in caliber. No obstructing calculus or other abnormality identified to the ampulla.   Pancreas: Incidental note of pancreas divisum. No pancreatic ductal dilatation or surrounding inflammatory changes.   Spleen: Normal  in size without significant abnormality.   Adrenals/Urinary Tract: Adrenal glands are unremarkable. Kidneys are normal, without renal calculi, solid lesion, or hydronephrosis.   Stomach/Bowel: Stomach is within normal limits. No evidence of bowel wall thickening, distention, or inflammatory changes. Moderate burden of stool in the included colon.   Vascular/Lymphatic: No significant vascular findings are present. No enlarged abdominal lymph nodes.   Other: No abdominal wall hernia or abnormality. No ascites.   Musculoskeletal: No acute or significant osseous findings.   IMPRESSION: 1. Unchanged intra and extrahepatic biliary ductal dilatation status post cholecystectomy, common bile duct measuring up to 1.3 cm in caliber. No obstructing calculus or other abnormality identified to the ampulla. This is not meaningfully changed compared to prior examinations, including MR dated 11/21/2021 and of doubtful clinical significance in the absence of clinical biochemical evidence of cholestasis. 2. Incidental note of pancreas divisum.    PAST GI PROCEDURES:   EGD 02/08/2022: - The examined portions of the nasopharynx, oropharynx and larynx were normal. - Normal esophagus.  - Ulcerated mucosa in the gastric body. Biopsied.  - Normal examined duodenum.   Colonoscopy 02/08/2022: - One 10 mm polyp in the cecum, removed with a cold snare. Resected and retrieved.  - One 3 mm polyp in the transverse colon, removed with a cold snare. Resected and retrieved.  - One 3 mm polyp in the descending colon, removed with a cold snare. Resected and retrieved.  - Many 1 to 4 mm polyps in the rectum, at the recto-sigmoid colon and in the sigmoid colon, removed with a cold snare. Resected and retrieved.  - The examined portion of the ileum was normal. - Recall colonoscopy 3 years    1. Surgical [P], gastric - ATYPICAL LAMINA PROPRIA B-CELL INFILTRATE WITH FOCAL LYMPHOEPITHELIAL LESIONS MOST  CONSISTENT WITH A B-CELL LYMPHOPROLIFERATIVE DISORDER SUCH AS AN EXTRANODAL MARGINAL ZONE LYMPHOMA OF MUCOSA ASSOCIATED LYMPHOID TISSUE (MALTOMA). - MOLECULAR TESTING TO CONFIRM B-CELL CLONALITY IS PENDING AND WILL BE REPORTED IN AN ADDENDUM. - SEE COMMENT. 2. Surgical [P], colon, descending, transverse, and cecum, polyp (3) - TUBULAR ADENOMA, FRAGMENTS. 3. Surgical [P], colon, sigmoid, polyp (1) - HYPERPLASTIC POLYP.   EGD 10/19/2022: - The examined portions of the nasopharynx, oropharynx and larynx were normal.  - Normal esophagus.  - Abnormal appearing focal ulcerated mucosa in the anterior wall of the gastric body and greater curvature of the gastric body. Biopsied.  - Focal ulcerated mucosa in the gastric body. Biopsied.  - Erythematous mucosa in the stomach. Biopsied. 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  - Normal examined duodenum.  - Findings are  suggestive of persistent MALT, possibly with progression.   1. Surgical [P], gastric antrum ANTRAL MUCOSA WITH CHANGES OF REACTIVE GASTROPATHY. NEGATIVE FOR HELICOBACTER PYLORI. NEGATIVE FOR ATYPICAL LYMPHOID INFILTRATES. 2. 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 NEGATIVE FOR HELICOBACTER PYLORI. NEGATIVE FOR ATYPICAL LYMPHOID INFILTRATES. 3. Surgical [P], focal ulceration of gastric body ANTRAL AND OXYNTIC MUCOSA WITH SLIGHT CHRONIC INFLAMMATION UREMIA. NEGATIVE FOR HELICOBACTER PYLORI. 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    EGD/EUS 02/26/2023 by Dr. Wilhelmenia:   EGD impression: - No gross lesions in the entire esophagus. Z-line regular, 36 cm from the incisors.  - 1 cm hiatal hernia.  - Gastric mucosal atrophy noted on the anterior wall of the gastric body. Biopsied.  - No other gross lesions in the entire stomach.  - No gross lesions in the duodenal bulb, in the  first portion of the duodenum and in the second portion of the duodenum. - Normal major papilla.  A. STOMACH, ANTERIOR BODY, BIOPSY:  - Oxyntic mucosa with minimal chronic inflammation and focal, mild  proton pump inhibitor type effect/early fundic gland polyp like change  - Negative for Helicobacter organisms on HE stain  - Negative for intestinal metaplasia, dysplasia or malignancy    EUS impression:  - There was dilation in the common bile duct and in the common hepatic duct. No evidence of any stones with normal contour throughout.  - Pancreatic parenchymal abnormalities consisting of hyperechoic strands were noted in the entire pancreas.  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 The pancreatic duct was noted within the head going towards the major papilla and minor papilla (type III pancreas divisum); normal appearance of the pancreatic duct within the genu of the pancreas, body of the pancreas and tail of the pancreas otherwise.  - No malignant-appearing lymph nodes were visualized in the celiac region (level 20), peripancreatic region and porta hepatis region.  EGD 06/2024 for nausea and vomiting - The examined portions of the nasopharynx, oropharynx and larynx were normal.  - Normal esophagus.  - Scarring in the incisura.  - Normal examined duodenum.  - Biopsies were taken with a cold forceps for Helicobacter pylori testing.  - No obvious abnormalities to explain patient' s symptoms. Suspect symptoms may be related to opioid effects on gastric motility.  1. Surgical [P], gastric antrum :      - BENIGN GASTRIC MUCOSA WITH NO SPECIFIC PATHOLOGIC CHANGE.      - NO SIGNIFICANT LYMPHOCYTIC INFILTRATE.      - NO EVIDENCE OF H. PYLORI ON H&E STAIN.       2. Surgical [P], gastric incisura :      - BENIGN GASTRIC MUCOSA WITH NO SPECIFIC PATHOLOGIC CHANGE.      - NO SIGNIFICANT LYMPHOCYTIC INFILTRATE.      - NO EVIDENCE OF H. PYLORI ON H&E STAIN.       3. Surgical [P], gastric  body :      - BENIGN GASTRIC MUCOSA WITH NO SPECIFIC PATHOLOGIC CHANGE.      - NO SIGNIFICANT LYMPHOCYTIC INFILTRATE.      - NO EVIDENCE OF H. PYLORI ON H&E STAIN    Avagrace Botelho Mollie RIGGERS [facility] 10/07/2024, 1:08 PM  Cc: Norval Kettle, MD "

## 2024-10-13 ENCOUNTER — Ambulatory Visit: Admitting: Nurse Practitioner

## 2024-12-08 ENCOUNTER — Ambulatory Visit: Admitting: Cardiology
